# Patient Record
Sex: Male | Born: 1950
Health system: Southern US, Community
[De-identification: ages and names within clinical notes are randomized; demographics above are authoritative.]

## PROBLEM LIST (undated history)

## (undated) DIAGNOSIS — B159 Hepatitis A without hepatic coma: Secondary | ICD-10-CM

## (undated) DIAGNOSIS — Z8612 Personal history of poliomyelitis: Secondary | ICD-10-CM

## (undated) HISTORY — DX: Personal history of poliomyelitis: Z86.12

## (undated) HISTORY — DX: Hepatitis a without hepatic coma: B15.9

---

## 1973-03-30 DIAGNOSIS — Z8612 Personal history of poliomyelitis: Secondary | ICD-10-CM

## 1973-03-30 HISTORY — DX: Personal history of poliomyelitis: Z86.12

## 2009-02-27 ENCOUNTER — Emergency Department: Payer: Self-pay | Admitting: Emergency Medicine

## 2011-01-13 ENCOUNTER — Ambulatory Visit (INDEPENDENT_AMBULATORY_CARE_PROVIDER_SITE_OTHER): Payer: 59 | Admitting: Family Medicine

## 2011-01-13 ENCOUNTER — Encounter: Payer: Self-pay | Admitting: Family Medicine

## 2011-01-13 VITALS — BP 130/86 | HR 80 | Temp 98.3°F | Ht 65.4 in | Wt 143.0 lb

## 2011-01-13 DIAGNOSIS — M25519 Pain in unspecified shoulder: Secondary | ICD-10-CM

## 2011-01-13 DIAGNOSIS — M549 Dorsalgia, unspecified: Secondary | ICD-10-CM
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
## 2011-05-01 HISTORY — PX: COLONOSCOPY: SHX174

## 2011-09-01 ENCOUNTER — Telehealth: Payer: Self-pay

## 2011-09-01 NOTE — Telephone Encounter (Signed)
Pt request rx for lab order for [facility] prior to CPX on 09/21/11. Call pt when ready for pick up

## 2011-09-07 NOTE — Telephone Encounter (Signed)
done

## 2011-09-07 NOTE — Telephone Encounter (Signed)
Patient notified by telephone that order is up front and ready for pickup. 

## 2011-09-15 ENCOUNTER — Encounter: Payer: Self-pay | Admitting: Family Medicine

## 2011-09-21 ENCOUNTER — Encounter: Payer: Self-pay | Admitting: Family Medicine

## 2011-09-21 ENCOUNTER — Ambulatory Visit (INDEPENDENT_AMBULATORY_CARE_PROVIDER_SITE_OTHER): Payer: 59 | Admitting: Family Medicine

## 2011-09-21 VITALS — BP 130/82 | HR 77 | Temp 97.6°F | Ht 65.0 in | Wt 139.0 lb

## 2011-09-21 DIAGNOSIS — Z0000 Encounter for general adult medical examination without abnormal findings: Secondary | ICD-10-CM

## 2011-09-21 NOTE — Progress Notes (Signed)
Nature conservation officer at [facility] [address] [place] [address] Phone: [phone] Fax: [phone]   Patient Name: Danny Cameron Date of Birth: 1951/02/19 Medical Record Number: [id number] Gender: male Date of Encounter: 09/21/2011  History of Present Illness:  Danny Cameron is a 61 y.o. very pleasant male patient who presents with the following:  Zostavax  Some late age - cad. Father, etoh and smoking, died from 31, chf  Lateral raises - R posterior a little sore behind shoulder  Preventative Health Maintenance Visit:  Health Maintenance Summary Reviewed and updated, unless pt declines services.  Tobacco History Reviewed. Alcohol: No concerns, no excessive use Exercise Habits: below, 12 hours a week STD concerns: no risk or activity to increase risk Drug Use: None Encouraged self-testicular check  Health Maintenance  Topic Date Due  . Tetanus/tdap  03/22/1970  . Zostavax  03/23/2011  . Influenza Vaccine  12/29/2011  . Colonoscopy  06/14/2019    There is no problem list on file for this patient.  Past Medical History  Diagnosis Date  . History of post-polio syndrome 1975    No residual weakness   No past surgical history on file. History  Substance Use Topics  . Smoking status: Former Games developer  . Smokeless tobacco: Not on file  . Alcohol Use: Yes   Family History  Problem Relation Age of Onset  . Heart failure Father    No Known Allergies  Medication list has been reviewed and updated.  Prior to Admission medications   Medication Sig Start Date End Date Taking? Authorizing Provider  aspirin 81 MG tablet Take 81 mg by mouth daily.     Yes Historical Provider, MD  Multiple Vitamin (MULTIVITAMIN PO) Take 1 tablet by mouth daily.     Yes Historical Provider, MD    Review of Systems:   General: Denies fever, chills, sweats. No significant weight loss. Eyes: Denies blurring,significant itching ENT: Denies earache, sore throat,  and hoarseness. Cardiovascular: Denies chest pains, palpitations, dyspnea on exertion Respiratory: Denies cough, dyspnea at rest,wheeezing Breast: no concerns about lumps GI: Denies nausea, vomiting, diarrhea, constipation, change in bowel habits, abdominal pain, melena, hematochezia GU: Denies penile discharge, ED, urinary flow / outflow problems. No STD concerns. Musculoskeletal: Denies back pain, joint pain - shoulder as above Derm: Denies rash, itching Neuro: Denies  paresthesias, frequent falls, frequent headaches Psych: Denies depression, anxiety Endocrine: Denies cold intolerance, heat intolerance, polydipsia Heme: Denies enlarged lymph nodes Allergy: No hayfever  Physical Examination: Filed Vitals:   09/21/11 0754  BP: 130/82  Pulse: 77  Temp: 97.6 F (36.4 C)   Filed Vitals:   09/21/11 0754  Height: 5\' 5"  (1.651 m)  Weight: 139 lb (63.05 kg)   Body mass index is 23.13 kg/(m^2). Ideal Body Weight: Weight in (lb) to have BMI = 25: 149.9    Wt Readings from Last 3 Encounters:  09/21/11 139 lb (63.05 kg)  01/13/11 143 lb (64.864 kg)    GEN: well developed, well nourished, no acute distress Eyes: conjunctiva and lids normal, PERRLA, EOMI ENT: TM clear, nares clear, oral exam WNL Neck: supple, no lymphadenopathy, no thyromegaly, no JVD Pulm: clear to auscultation and percussion, respiratory effort normal CV: regular rate and rhythm, S1-S2, no murmur, rub or gallop, no bruits, peripheral pulses normal and symmetric, no cyanosis, clubbing, edema or varicosities Chest: no scars, masses GI: soft, non-tender; no hepatosplenomegaly, masses; active bowel sounds all quadrants GU: no hernia, testicular mass, penile discharge, or prostate enlargement Lymph:  no cervical, axillary or inguinal adenopathy MSK: gait normal, muscle tone and strength WNL, no joint swelling, effusions, discoloration, crepitus  SKIN: clear, good turgor, color WNL, no rashes, lesions, or  ulcerations Neuro: normal mental status, normal strength, sensation, and motion Psych: alert; oriented to person, place and time, normally interactive and not anxious or depressed in appearance.  Assessment and Plan: 1. Routine general medical examination at a health care facility     The patient's preventative maintenance and recommended screening tests for an annual wellness exam were reviewed in full today. Brought up to date unless services declined.  Counselled on the importance of diet, exercise, and its role in overall health and mortality. The patient's FH and SH was reviewed, including their home life, tobacco status, and drug and alcohol status.   Doing great - working out 12 hours a week, biking and swimming  Hannah Beat, MD

## 2011-10-22 ENCOUNTER — Ambulatory Visit: Payer: 59

## 2011-11-17 ENCOUNTER — Ambulatory Visit (INDEPENDENT_AMBULATORY_CARE_PROVIDER_SITE_OTHER): Payer: 59 | Admitting: *Deleted

## 2011-11-17 DIAGNOSIS — Z2911 Encounter for prophylactic immunotherapy for respiratory syncytial virus (RSV): Secondary | ICD-10-CM

## 2011-11-17 DIAGNOSIS — Z23 Encounter for immunization: Secondary | ICD-10-CM

## 2011-12-09 ENCOUNTER — Ambulatory Visit (INDEPENDENT_AMBULATORY_CARE_PROVIDER_SITE_OTHER): Payer: 59 | Admitting: Family Medicine

## 2011-12-09 ENCOUNTER — Encounter: Payer: Self-pay | Admitting: Family Medicine

## 2011-12-09 VITALS — BP 120/78 | HR 79 | Temp 97.9°F | Wt 138.0 lb

## 2011-12-09 DIAGNOSIS — M542 Cervicalgia: Secondary | ICD-10-CM

## 2011-12-09 NOTE — Progress Notes (Signed)
Nature conservation officer at [facility] [address] [place] [address] Phone: [phone] Fax: [phone]  Date:  12/09/2011   Name:  Danny Cameron   DOB:  December 18, 1950   MRN:  [id number] Gender: male Age: 61 y.o.  PCP:  Hannah Beat, MD    Chief Complaint: Shoulder Pain   History of Present Illness:  Danny Cameron is a 61 y.o. pleasant patient who presents with the following:  Sometimes having issues for a while, went to the chiropractor last week.   Primarily he is having some neck pain and stiffness in the posterior aspect of his neck. He did go to a chiropractor last week which did not really help at all. He denies any numbness or tingling. Some pain in the shoulder as well.  No significant pain with abduction or internal rotation. No prior traumatic injury or fracture or surgery in the affected regions to  There is no problem list on file for this patient.   Past Medical History  Diagnosis Date  . History of post-polio syndrome 1975    No residual weakness    No past surgical history on file.  History  Substance Use Topics  . Smoking status: Former Games developer  . Smokeless tobacco: Not on file  . Alcohol Use: Yes    Family History  Problem Relation Age of Onset  . Heart failure Father     No Known Allergies  Medication list has been reviewed and updated.  Current Outpatient Prescriptions on File Prior to Visit  Medication Sig Dispense Refill  . aspirin 81 MG tablet Take 81 mg by mouth daily.        . Multiple Vitamin (MULTIVITAMIN PO) Take 1 tablet by mouth daily.          Review of Systems:   GEN: No fevers, chills. Nontoxic. Primarily MSK c/o today. MSK: Detailed in the HPI GI: tolerating PO intake without difficulty Neuro: No numbness, parasthesias, or tingling associated. Otherwise the pertinent positives of the ROS are noted above.    Physical Examination: Filed Vitals:   12/09/11 0823  BP: 120/78  Pulse: 79  Temp: 97.9  F (36.6 C)   Filed Vitals:   12/09/11 0823  Weight: 138 lb (62.596 kg)   There is no height on file to calculate BMI. Ideal Body Weight:     GEN: Well-developed,well-nourished,in no acute distress; alert,appropriate and cooperative throughout examination HEENT: Normocephalic and atraumatic without obvious abnormalities. Ears, externally no deformities PULM: Breathing comfortably in no respiratory distress EXT: No clubbing, cyanosis, or edema PSYCH: Normally interactive. Cooperative during the interview. Pleasant. Friendly and conversant. Not anxious or depressed appearing. Normal, full affect.  CERVICAL SPINE EXAM Range of motion: Flexion, extension, lateral bending, and rotation: Full Pain with terminal motion: Full, no pain Spinous Processes: NT SCM: NT Upper paracervical muscles: mild tenderness Upper traps: mild tenderness C5-T1 intact, sensation and motor  Shoulder: B Inspection: No muscle wasting or winging Ecchymosis/edema: neg  AC joint, scapula, clavicle: NT Cervical spine: NT, full ROM Spurling's: neg Abduction: full, 5/5 Flexion: full, 5/5 IR, full, lift-off: 5/5 ER at neutral: full, 5/5 AC crossover and compression: neg Neer: neg Hawkins: neg Drop Test: neg Empty Can: neg Supraspinatus insertion: NT Bicipital groove: NT Speed's: neg Yergason's: neg Sulcus sign: neg Scapular dyskinesis: none C5-T1 intact Sensation intact Grip 5/5   Assessment and Plan:  1. Cervicalgia    Reviewed basic neck rehabilitation, McKenzie protocol with the patient. Do not suspect occult neurosurgical pathology. No  red flags.  Orders Today:  No orders of the defined types were placed in this encounter.    Medications Today: (Includes new updates added during medication reconciliation) No orders of the defined types were placed in this encounter.    Medications Discontinued: There are no discontinued medications.   Hannah Beat, MD

## 2011-12-22 ENCOUNTER — Telehealth: Payer: Self-pay | Admitting: Family Medicine

## 2011-12-22 NOTE — Telephone Encounter (Signed)
Caller: Harvest/Patient;  PCP: Hannah Beat (Family Practice); Best Callback Phone Number: [phone] Patient reports onset 12/21/11 of what he thinks are bites, on arms, genital area, afebrile, itchy, denies pain. Guideline: Bites, Insect. Disposition: Home care due to itchy bite, care advice offered, verbalizes understanding of call back parameters.

## 2011-12-22 NOTE — Telephone Encounter (Signed)
Noted  

## 2012-03-04 ENCOUNTER — Telehealth: Payer: Self-pay | Admitting: Family Medicine

## 2012-03-04 NOTE — Telephone Encounter (Signed)
Patient Information:  Caller Name: Mekhi  Phone: [phone]  Patient: Estell, Dillinger  Gender: Male  DOB: 03-21-1951  Age: 61 Years  PCP: Hannah Beat (Family Practice)   Symptoms  Reason For Call & Symptoms: Patient states right ear discomfort onset yesterday 03/03/12.  He states he has swam four days this week.  No pain with touching, no drainage.  He states down from his chin and down his neck is slightly painful. Dry throat but does not describe a sore throat. Top of head sensitive.  Reviewed Health History In EMR: Yes  Reviewed Medications In EMR: Yes  Reviewed Allergies In EMR: Yes  Reviewed Surgeries / Procedures: No  Date of Onset of Symptoms: 03/03/2012  Treatments Tried: Advil this morning.  Treatments Tried Worked: No  Guideline(s) Used:  Earache  Disposition Per Guideline:   See Today in Office  Reason For Disposition Reached:   All other earaches (Exceptions: earache lasting < 1 hour, and earache from air travel)  Advice Given:  Pain Medicines:  For pain relief, you can take either acetaminophen, ibuprofen, or naproxen.  They are over-the-counter (OTC) pain drugs. You can buy them at the drugstore.  Apply Cold to the Area for Pain:  Apply a cold pack or a cold wet washcloth to the outer ear for 20 minutes to reduce pain while the pain medicine takes effect (Note: Some individuals prefer local heat instead of cold for 20 minutes).  Call Back If  Earache last more than 1 hour  High fever, severe headache, or stiff neck occurs  You become worse.  Office Follow Up:  Does the office need to follow up with this patient?: No  Instructions For The Office: Declines appt today. Out of town. Request appt for tomorrow. Home care instructions provided.   Appointment Scheduled:  03/05/2012 10:00:00 Appointment Scheduled Provider:  Eustaquio Boyden [facility])

## 2012-03-05 ENCOUNTER — Telehealth: Payer: Self-pay | Admitting: Family Medicine

## 2012-03-05 ENCOUNTER — Encounter: Payer: Self-pay | Admitting: Family Medicine

## 2012-03-05 ENCOUNTER — Ambulatory Visit (INDEPENDENT_AMBULATORY_CARE_PROVIDER_SITE_OTHER): Payer: 59 | Admitting: Family Medicine

## 2012-03-05 VITALS — BP 160/100 | HR 76 | Temp 97.8°F | Wt 143.5 lb

## 2012-03-05 DIAGNOSIS — H60399 Other infective otitis externa, unspecified ear: Secondary | ICD-10-CM

## 2012-03-05 DIAGNOSIS — R030 Elevated blood-pressure reading, without diagnosis of hypertension: Secondary | ICD-10-CM

## 2012-03-05 DIAGNOSIS — IMO0001 Reserved for inherently not codable concepts without codable children: Secondary | ICD-10-CM

## 2012-03-05 DIAGNOSIS — H6091 Unspecified otitis externa, right ear: Secondary | ICD-10-CM

## 2012-03-05 MED ORDER — HYDROCORTISONE-ACETIC ACID 1-2 % OT SOLN: 5.0000 [drp] | Freq: Two times a day (BID) | OTIC | Status: DC

## 2012-03-05 NOTE — Telephone Encounter (Signed)
I tried to call pt today - unable to reach. Pt seen at [facility] - elevated bp noticed. Can we call to ask him to keep track of bp over next few weeks at home if he can, if not to go 1-2x/wk to local pharmacy or grocery store like [facility], [facility], [facility] and monitor bp.  If consistently >140/90, come in for further evaluation of hypertension.  In interim, lots of water, avoid salty foods/sodium.

## 2012-03-05 NOTE — Assessment & Plan Note (Signed)
Mild R external otitis.  Treat with vosol HC. Continue NSAID. Update if not improving with treatment. Pt agrees with plan.

## 2012-03-05 NOTE — Progress Notes (Addendum)
  Subjective:    Patient ID: Danny Cameron, male    DOB: Jun 24, 1950, 61 y.o.   MRN: [id number]  HPI CC: R ear pain  2-3 d h/o R ear pain, water clogged feeling.  No muffled hearing.  Feels some swollen glands R neck. No fevers/chills, congestion, RN, [facility]  Swimmer - increased swimming recently (4x this week)  Swims year round.  Has not had external ear infection in last 8 years.  Taking ibuprofen - helping.  HTNsive today - no h/o this.  Past Medical History  Diagnosis Date  . History of post-polio syndrome 1975    No residual weakness     Review of Systems Per HPI    Objective:   Physical Exam  Nursing note and vitals reviewed. Constitutional: He appears well-developed and well-nourished. No distress.  HENT:  Head: Normocephalic and atraumatic.  Right Ear: Hearing, tympanic membrane and external ear normal.  Left Ear: Hearing, tympanic membrane, external ear and ear canal normal.  Nose: Nose normal. No mucosal edema or rhinorrhea. Right sinus exhibits no maxillary sinus tenderness and no frontal sinus tenderness. Left sinus exhibits no maxillary sinus tenderness and no frontal sinus tenderness.  Mouth/Throat: Uvula is midline and mucous membranes are normal. No oropharyngeal exudate, posterior oropharyngeal edema, posterior oropharyngeal erythema or tonsillar abscesses.       Mild erythema anterior R ear canal.  No TM perforation.  Neck: Normal range of motion. Neck supple.  Lymphadenopathy:    He has cervical adenopathy (R AC LAD, mild).       Assessment & Plan:

## 2012-03-05 NOTE — Patient Instructions (Addendum)
You do have R outer ear infection. Treat with vosol hc to pharmacy twice daily for 7 days. Take ibuprofen 400mg  2-3 time daily with food. Out of swimming for 1 week starting from the point where ear doesn't hurt anymore.  Otitis Externa Otitis externa is a bacterial or fungal infection of the outer ear canal. This is the area from the eardrum to the outside of the ear. Otitis externa is sometimes called "swimmer's ear." CAUSES  Possible causes of infection include:  Swimming in dirty water.  Moisture remaining in the ear after swimming or bathing.  Mild injury (trauma) to the ear.  Objects stuck in the ear (foreign body).  Cuts or scrapes (abrasions) on the outside of the ear. SYMPTOMS  The first symptom of infection is often itching in the ear canal. Later signs and symptoms may include swelling and redness of the ear canal, ear pain, and yellowish-white fluid (pus) coming from the ear. The ear pain may be worse when pulling on the earlobe. DIAGNOSIS  Your caregiver will perform a physical exam. A sample of fluid may be taken from the ear and examined for bacteria or fungi. TREATMENT  Antibiotic ear drops are often given for 10 to 14 days. Treatment may also include pain medicine or corticosteroids to reduce itching and swelling. PREVENTION   Keep your ear dry. Use the corner of a towel to absorb water out of the ear canal after swimming or bathing.  Avoid scratching or putting objects inside your ear. This can damage the ear canal or remove the protective wax that lines the canal. This makes it easier for bacteria and fungi to grow.  Avoid swimming in lakes, polluted water, or poorly chlorinated pools.  You may use ear drops made of rubbing alcohol and vinegar after swimming. Combine equal parts of white vinegar and alcohol in a bottle. Put 3 or 4 drops into each ear after swimming. HOME CARE INSTRUCTIONS   Apply antibiotic ear drops to the ear canal as prescribed by your  caregiver.  Only take over-the-counter or prescription medicines for pain, discomfort, or fever as directed by your caregiver.  If you have diabetes, follow any additional treatment instructions from your caregiver.  Keep all follow-up appointments as directed by your caregiver. SEEK MEDICAL CARE IF:   You have a fever.  Your ear is still red, swollen, painful, or draining pus after 3 days.  Your redness, swelling, or pain gets worse.  You have a severe headache.  You have redness, swelling, pain, or tenderness in the area behind your ear. MAKE SURE YOU:   Understand these instructions.  Will watch your condition.  Will get help right away if you are not doing well or get worse. Document Released: 03/16/2005 Document Revised: 06/08/2011 Document Reviewed: 04/02/2011 [facility] Patient Information 2013 [place], [place].

## 2012-03-05 NOTE — Assessment & Plan Note (Signed)
Noted elevated blood pressure - no history of same. I will ask Selena Batten to call pt on Monday to start monitoring bp at home or at local store - if staying consistently elevated, come in for further eval.

## 2012-03-07 NOTE — Telephone Encounter (Signed)
Patient notified as instructed by telephone. Was advised by patient that he has a BP monitor kit at home and has been monitoring his BP and it normally is not high. Patient stated that he has seen Dr. Park Breed in the past and he recommended a stress test, but patient does not want to have that done. Patient states that he will continue to monitor his BP and if it is consistently >140/90 he will set up an appointment with his PCP and come in to discuss it.

## 2012-03-07 NOTE — Telephone Encounter (Signed)
Noted. Thanks.

## 2012-03-10 ENCOUNTER — Encounter: Payer: Self-pay | Admitting: Family Medicine

## 2012-03-10 ENCOUNTER — Ambulatory Visit (INDEPENDENT_AMBULATORY_CARE_PROVIDER_SITE_OTHER): Payer: 59 | Admitting: Family Medicine

## 2012-03-10 VITALS — BP 140/92 | HR 77 | Temp 98.0°F | Ht 65.0 in | Wt 136.0 lb

## 2012-03-10 DIAGNOSIS — IMO0001 Reserved for inherently not codable concepts without codable children: Secondary | ICD-10-CM

## 2012-03-10 DIAGNOSIS — R030 Elevated blood-pressure reading, without diagnosis of hypertension: Secondary | ICD-10-CM

## 2012-03-10 NOTE — Progress Notes (Signed)
   Nature conservation officer at [facility] [address] [place] [address] Phone: [phone] Fax: [phone]  Date:  03/10/2012   Name:  Danny Cameron   DOB:  [birth year]   MRN:  [id number] Gender: male Age: 61 y.o.  PCP:  Hannah Beat, MD  Evaluating MD: Hannah Beat, MD   Chief Complaint: Hypertension   History of Present Illness:  Danny Cameron is a 62 y.o. pleasant patient who presents with the following:  Swam a lot last week, and had some OE, had a swollen gland. Using drops, taking some ibupfren.   Patient Active Problem List  Diagnosis  . Elevated BP    Past Medical History  Diagnosis Date  . History of post-polio syndrome 1975    No residual weakness    No past surgical history on file.  History  Substance Use Topics  . Smoking status: Former Games developer  . Smokeless tobacco: Not on file  . Alcohol Use: Yes    Family History  Problem Relation Age of Onset  . Heart failure Father     No Known Allergies  Medication list has been reviewed and updated.  Outpatient Prescriptions Prior to Visit  Medication Sig Dispense Refill  . acetic acid-hydrocortisone (VOSOL-HC) otic solution Place 5 drops into the right ear 2 (two) times daily.  10 mL  0  . aspirin 81 MG tablet Take 81 mg by mouth daily.        . Multiple Vitamin (MULTIVITAMIN PO) Take 1 tablet by mouth daily.         Last reviewed on 03/10/2012  8:21 AM by Consuello Masse, CMA  Review of Systems:   GEN: No acute illnesses, no fevers, chills. GI: No n/v/d, eating normally Pulm: No SOB Interactive and getting along well at home.  Otherwise, ROS is as per the HPI.   Physical Examination: Filed Vitals:   03/10/12 0820  BP: 140/92  Pulse: 77  Temp: 98 F (36.7 C)  TempSrc: Oral  Height: 5\' 5"  (1.651 m)  Weight: 136 lb (61.689 kg)  SpO2: 99%    Body mass index is 22.63 kg/(m^2). Ideal Body Weight: Weight in (lb) to have BMI = 25: 149.9    GEN: WDWN, NAD,  Non-toxic, Alert & Oriented x 3 HEENT: Atraumatic, Normocephalic.  Ears and Nose: No external deformity. EXTR: No clubbing/cyanosis/edema NEURO: Normal gait.  PSYCH: Normally interactive. Conversant. Not depressed or anxious appearing.  Calm demeanor.    Assessment and Plan: 1. Elevated BP     >10 minutes spent in face to face time with patient, >50% spent in counselling or coordination of care : discussion regarding BP, diet, exercise. Some readings  > 140/90, but many not. OK to follow for now.   If ear not better by Monday, will call in different OE abx  Hannah Beat, MD

## 2012-03-14 ENCOUNTER — Telehealth: Payer: Self-pay | Admitting: *Deleted

## 2012-03-14 MED ORDER — OFLOXACIN 0.3 % OT SOLN: 10.0000 [drp] | Freq: Every day | OTIC | Status: DC

## 2012-03-14 NOTE — Telephone Encounter (Signed)
Done   Hannah Beat, MD 03/14/2012, 10:09 AM

## 2012-03-14 NOTE — Telephone Encounter (Signed)
Patient calling says that he was told to call back if ear not better by Monday to get another antibiotic. Patient says his ear is still not feeling 100% and would like [facility] [address]

## 2012-03-24 ENCOUNTER — Telehealth: Payer: Self-pay | Admitting: *Deleted

## 2012-03-24 NOTE — Telephone Encounter (Signed)
error 

## 2012-03-28 ENCOUNTER — Institutional Professional Consult (permissible substitution): Payer: 59 | Admitting: Family Medicine

## 2012-03-29 ENCOUNTER — Ambulatory Visit (INDEPENDENT_AMBULATORY_CARE_PROVIDER_SITE_OTHER): Payer: 59 | Admitting: Family Medicine

## 2012-03-29 ENCOUNTER — Encounter: Payer: Self-pay | Admitting: Family Medicine

## 2012-03-29 VITALS — BP 158/92 | HR 72 | Temp 97.8°F | Wt 142.8 lb

## 2012-03-29 DIAGNOSIS — H6090 Unspecified otitis externa, unspecified ear: Secondary | ICD-10-CM

## 2012-03-29 DIAGNOSIS — H60399 Other infective otitis externa, unspecified ear: Secondary | ICD-10-CM

## 2012-03-29 NOTE — Patient Instructions (Addendum)
Use nasal saline twice a day.  If you need a copy of the note, then request it at the end of the week.  Take care.   You can start back swimming next week.

## 2012-03-29 NOTE — Progress Notes (Signed)
Prev seen mid 12/13 with R otitis externa.  Had been swimming a lot prev to the visit.  Since then, some improvement with the ear pain but it still feels clogged.  LA in neck is resolved.  L ear is fine.  On FCNAVD.    Mother is ill, 61 years old, and isn't doing well.  He's been in [place] seeing her.    Meds, vitals, and allergies reviewed.   ROS: See HPI.  Otherwise, noncontributory.  GEN: nad, alert and oriented HEENT: mucous membranes moist, TM wnl B w/o erythema, no canal erythema, OP wnl NECK: supple w/o LA

## 2012-03-30 DIAGNOSIS — H6090 Unspecified otitis externa, unspecified ear: Secondary | ICD-10-CM | POA: Insufficient documentation

## 2012-03-30 NOTE — Assessment & Plan Note (Signed)
Resolved, he can use nasal saline in case he has a component of ETD. O/w f/u prn.  D/w pt.

## 2012-04-20 ENCOUNTER — Telehealth: Payer: Self-pay | Admitting: Family Medicine

## 2012-04-20 NOTE — Telephone Encounter (Signed)
Patient Information:  Caller Name: Benjie  Phone: [phone]  Patient: Danny Cameron, Danny Cameron  Gender: Male  DOB: 1951-02-22  Age: 62 Years  PCP: Hannah Beat (Family Practice)  Office Follow Up:  Does the office need to follow up with this patient?: No  Instructions For The Office: N/A   Symptoms  Reason For Call & Symptoms: pt has been dealing with recovering from swimmers ear since 03/11/13.  Pt reports he can still feel ear popping  Reviewed Health History In EMR: Yes  Reviewed Medications In EMR: Yes  Reviewed Allergies In EMR: Yes  Reviewed Surgeries / Procedures: Yes  Date of Onset of Symptoms: 03/11/2012  Treatments Tried: antibiotics  Treatments Tried Worked: No  Guideline(s) Used:  Ear - Congestion  Disposition Per Guideline:   See Within 3 Days in Office  Reason For Disposition Reached:   Ear congestion present > 48 hours  Advice Given:  Reassurance:  Eustacian tube: There is a small collapsible tube that runs between the middle ear and the nose. Normally, it permits tiny amounts of air to move in and out of the middle ear. When the tube gets blocked, air or fluid can build up behind the ear drum (tympanic membrane). This causes the symptoms of ear congestion.  Treatment - Chewing and Swallowing:   Try chewing gum.  You can also try swallowing water while pinching your nostrils closed. The reason this works is that it creates a small vacuum in the nose. This helps the eustachian tube to open up.  Treatment - Decongestant Nasal Spray:  If chewing or swallowing doesn't help after 1 or 2 hours, you can try using an over-the-counter (OTC) nasal decongestant drops. You can use the nose drops twice a day.  Oxymetazoline Nasal Drops (e.g., Afrin): Available OTC. Clean out the nose before using. Spray each nostril once, wait one minute for it to absorb, and then spray a second time.  Phenylephrine Nasal Drops (e.g., Neo-Synephrine): Available OTC. Clean out the  nose before using. Spray each nostril once, wait one minute for it to absorb, and then spray a second time.  Call Back If:   You become worse.  Patient Refused Recommendation:  Patient Refused Appt, Patient Requests Appt At Later Date  pt will try home measures and call back next week for an appt

## 2012-07-21 ENCOUNTER — Ambulatory Visit (INDEPENDENT_AMBULATORY_CARE_PROVIDER_SITE_OTHER): Payer: 59 | Admitting: Family Medicine

## 2012-07-21 ENCOUNTER — Encounter: Payer: Self-pay | Admitting: Family Medicine

## 2012-07-21 VITALS — BP 120/88 | HR 76 | Temp 97.9°F | Ht 65.0 in | Wt 138.5 lb

## 2012-07-21 DIAGNOSIS — J069 Acute upper respiratory infection, unspecified: Secondary | ICD-10-CM

## 2012-07-21 NOTE — Progress Notes (Signed)
  Subjective:    Patient ID: Danny Cameron, male    DOB: Sep 05, 1950, 62 y.o.   MRN: [id number]  Cough This is a new problem. The current episode started 1 to 4 weeks ago (Flew on plane 4/12). The problem has been gradually worsening. The problem occurs hourly (toruble sleepoing at noght from cough). The cough is productive of sputum. Associated symptoms include chills, ear congestion, myalgias and nasal congestion. Pertinent negatives include no ear pain, fever, hemoptysis, postnasal drip, rhinorrhea, sore throat, shortness of breath or wheezing. Associated symptoms comments: Initially chills, no fever.. Chills resolved now  slightly itchy eyes, no sneeze  fatigue. Nothing aggravates the symptoms. Risk factors for lung disease include smoking/tobacco exposure (Former remote ). He has tried nothing for the symptoms. There is no history of asthma, bronchiectasis, bronchitis, COPD, emphysema, environmental allergies or pneumonia.      Review of Systems  Constitutional: Positive for chills. Negative for fever.  HENT: Negative for ear pain, sore throat, rhinorrhea and postnasal drip.   Respiratory: Positive for cough. Negative for hemoptysis, shortness of breath and wheezing.   Musculoskeletal: Positive for myalgias.  Allergic/Immunologic: Negative for environmental allergies.       Objective:   Physical Exam  Constitutional: Vital signs are normal. He appears well-developed and well-nourished.  Non-toxic appearance. He does not appear ill. No distress.  HENT:  Head: Normocephalic and atraumatic.  Right Ear: Hearing, tympanic membrane, external ear and ear canal normal. No tenderness. No foreign bodies. Tympanic membrane is not retracted and not bulging.  Left Ear: Hearing, tympanic membrane, external ear and ear canal normal. No tenderness. No foreign bodies. Tympanic membrane is not retracted and not bulging.  Nose: Nose normal. No mucosal edema or rhinorrhea. Right sinus exhibits no  maxillary sinus tenderness and no frontal sinus tenderness. Left sinus exhibits no maxillary sinus tenderness and no frontal sinus tenderness.  Mouth/Throat: Uvula is midline, oropharynx is clear and moist and mucous membranes are normal. Normal dentition. No dental caries. No oropharyngeal exudate or tonsillar abscesses.  Eyes: Conjunctivae, EOM and lids are normal. Pupils are equal, round, and reactive to light. No foreign bodies found.  Neck: Trachea normal, normal range of motion and phonation normal. Neck supple. Carotid bruit is not present. No mass and no thyromegaly present.  Cardiovascular: Normal rate, regular rhythm, S1 normal, S2 normal, normal heart sounds, intact distal pulses and normal pulses.  Exam reveals no gallop.   No murmur heard. Pulmonary/Chest: Effort normal and breath sounds normal. No respiratory distress. He has no wheezes. He has no rhonchi. He has no rales.  Abdominal: Soft. Normal appearance and bowel sounds are normal. There is no hepatosplenomegaly. There is no tenderness. There is no rebound, no guarding and no CVA tenderness. No hernia.  Neurological: He is alert. He has normal reflexes.  Skin: Skin is warm, dry and intact. No rash noted.  Psychiatric: He has a normal mood and affect. His speech is normal and behavior is normal. Judgment normal.          Assessment & Plan:

## 2012-07-21 NOTE — Patient Instructions (Addendum)
Mucinex DM twice daily. Nasal saline irrigation  ( Netty pot) or spray 2-3 times a day. If you are not improving in 5-7 days... Or new measured fever... Call.

## 2012-08-01 ENCOUNTER — Telehealth: Payer: Self-pay | Admitting: Family Medicine

## 2012-08-01 NOTE — Telephone Encounter (Signed)
Patient is scheduled for his cpx on 09/22/12.  Patient works for [employer] and would like an order for lab work mailed to him.

## 2012-08-05 ENCOUNTER — Telehealth: Payer: Self-pay | Admitting: Family Medicine

## 2012-08-05 NOTE — Telephone Encounter (Signed)
Pt is scheduled for a CPE on May 29th.  He works for [employer] and needs to have his labs drawn there. Can you write an order for the labs and call him when it is ready to pick up? Thank you.

## 2012-08-08 MED ORDER — NONFORMULARY OR COMPOUNDED ITEM: Status: DC

## 2012-08-08 NOTE — Telephone Encounter (Signed)
Done   Hannah Beat, MD 08/08/2012, 1:53 PM

## 2012-08-08 NOTE — Telephone Encounter (Signed)
done

## 2012-08-25 ENCOUNTER — Encounter: Payer: Self-pay | Admitting: Family Medicine

## 2012-08-25 ENCOUNTER — Telehealth: Payer: Self-pay

## 2012-08-25 ENCOUNTER — Ambulatory Visit (INDEPENDENT_AMBULATORY_CARE_PROVIDER_SITE_OTHER): Payer: 59 | Admitting: Family Medicine

## 2012-08-25 VITALS — BP 124/88 | HR 74 | Temp 98.1°F | Ht 65.0 in | Wt 132.0 lb

## 2012-08-25 DIAGNOSIS — Z0000 Encounter for general adult medical examination without abnormal findings: Secondary | ICD-10-CM

## 2012-08-25 NOTE — Progress Notes (Signed)
Nature conservation officer at [facility] [address] [place] [address] Phone: [phone] Fax: [phone]  Date:  08/25/2012   Name:  Danny Cameron   DOB:  1951/03/07   MRN:  [id number] Gender: male Age: 62 y.o.  Primary Physician:  Hannah Beat, MD  Evaluating MD: Hannah Beat, MD   Chief Complaint: Annual Exam   History of Present Illness:  Danny Cameron is a 62 y.o. pleasant patient who presents with the following:  1500 miles on bike Swimming a mile twice a week  Preventative Health Maintenance Visit:  Health Maintenance Summary Reviewed and updated, unless pt declines services.  Tobacco History Reviewed. Alcohol: No concerns, no excessive use Exercise Habits: above STD concerns: no risk or activity to increase risk Drug Use: None Encouraged self-testicular check  Labs reviewed, normal  Patient Active Problem List   Diagnosis Date Noted  . Elevated BP 03/05/2012    Past Medical History  Diagnosis Date  . History of post-polio syndrome 1975    No residual weakness    No past surgical history on file.  History   Social History  . Marital Status: Single    Spouse Name: N/A    Number of Children: 0  . Years of Education: N/A   Occupational History  . 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 Smoking status: Former Games developer  . Smokeless tobacco: Never Used  . Alcohol Use: Yes  . Drug Use: No  . Sexually Active: Not on file   Other Topics Concern  . Not on file   Social History Narrative   Active cyclist, swimmer   Exercises 12 hours a week. Swims 1 mile at a time, 1 hour twice a week    Family History  Problem Relation Age of Onset  . Heart failure Father     No Known Allergies  Medication list has been reviewed and updated.  Outpatient Prescriptions Prior to Visit  Medication Sig Dispense Refill  . aspirin 81 MG tablet Take 81 mg by mouth daily.       . Multiple Vitamin (MULTIVITAMIN  PO) Take 1 tablet by mouth daily.        . Isopropyl Alcohol (SWIM EAR OT) Place in ear(s) as needed.      . NONFORMULARY OR COMPOUNDED ITEM Epic Account: [id number] Lab Studies: BMP, CBC with diff, HFP: v58.69 FLP: 272.4 PSA: v76.44 Nicotine and metabolite, Quant: v70.0  1 each  0  . pseudoephedrine (SUDAFED) 30 MG tablet Take 30 mg by mouth every 4 (four) hours as needed.       No facility-administered medications prior to visit.    Review of Systems:   General: Denies fever, chills, sweats. No significant weight loss. Eyes: Denies blurring,significant itching ENT: Denies earache, sore throat, and hoarseness. Cardiovascular: Denies chest pains, palpitations, dyspnea on exertion Respiratory: Denies cough, dyspnea at rest,wheeezing Breast: no concerns about lumps GI: Denies nausea, vomiting, diarrhea, constipation, change in bowel habits, abdominal pain, melena, hematochezia GU: Denies penile discharge, ED, urinary flow / outflow problems. No STD concerns. Musculoskeletal: Denies back pain, joint pain Derm: Denies rash, itching Neuro: Denies  paresthesias, frequent falls, frequent headaches Psych: Denies depression, anxiety Endocrine: Denies cold intolerance, heat intolerance, polydipsia Heme: Denies enlarged lymph nodes Allergy: No hayfever   Physical Examination: BP 124/88  Pulse 74  Temp(Src) 98.1 F (36.7 C) (Oral)  Ht 5\' 5"  (1.651 m)  Wt 132 lb (59.875 kg)  BMI 21.97 kg/m2  SpO2  98%  Ideal Body Weight: Weight in (lb) to have BMI = 25: 149.9   Wt Readings from Last 3 Encounters:  08/25/12 132 lb (59.875 kg)  07/21/12 138 lb 8 oz (62.823 kg)  03/29/12 142 lb 12 oz (64.751 kg)    GEN: well developed, well nourished, no acute distress Eyes: conjunctiva and lids normal, PERRLA, EOMI ENT: TM clear, nares clear, oral exam WNL Neck: supple, no lymphadenopathy, no thyromegaly, no JVD Pulm: clear to auscultation and percussion, respiratory effort normal CV: regular  rate and rhythm, S1-S2, no murmur, rub or gallop, no bruits, peripheral pulses normal and symmetric, no cyanosis, clubbing, edema or varicosities Chest: no scars, masses GI: soft, non-tender; no hepatosplenomegaly, masses; active bowel sounds all quadrants GU: no hernia, testicular mass, penile discharge, or prostate enlargement Lymph: no cervical, axillary or inguinal adenopathy MSK: gait normal, muscle tone and strength WNL, no joint swelling, effusions, discoloration, crepitus  SKIN: clear, good turgor, color WNL, no rashes, lesions, or ulcerations Neuro: normal mental status, normal strength, sensation, and motion Psych: alert; oriented to person, place and time, normally interactive and not anxious or depressed in appearance.  Assessment and Plan:  Routine general medical examination at a health care facility  The patient's preventative maintenance and recommended screening tests for an annual wellness exam were reviewed in full today. Brought up to date unless services declined.  Counselled on the importance of diet, exercise, and its role in overall health and mortality. The patient's FH and SH was reviewed, including their home life, tobacco status, and drug and alcohol status.   Doing great.  Orders Today:  No orders of the defined types were placed in this encounter.    Updated Medication List: (Includes new medications, updates to list, dose adjustments) No orders of the defined types were placed in this encounter.    Medications Discontinued: Medications Discontinued During This Encounter  Medication Reason  . NONFORMULARY OR COMPOUNDED ITEM Error  . Isopropyl Alcohol (SWIM EAR OT) Error  . pseudoephedrine (SUDAFED) 30 MG tablet Error      Signed, Caniya Tagle T. Jessaca Philippi, MD 08/25/2012 9:05 AM

## 2012-08-25 NOTE — Telephone Encounter (Signed)
i ordered it and it is clearly on my order. My only explanation is that it was an error from [employer].    Hannah Beat, MD 08/25/2012, 3:22 PM

## 2012-08-25 NOTE — Telephone Encounter (Signed)
Pt seen today; pt wants to know why lipid panel was not done this year. Please advise.

## 2012-08-25 NOTE — Telephone Encounter (Signed)
Left message advising patient as requested by dr copland

## 2012-08-26 ENCOUNTER — Telehealth: Payer: Self-pay | Admitting: *Deleted

## 2012-08-26 NOTE — Telephone Encounter (Signed)
Patient called back and asked if he really needs his cholesterol checked since it has been so good in the past.

## 2012-08-28 NOTE — Telephone Encounter (Signed)
I think it should be ok to wait this year - always perfect.   Hannah Beat, MD 08/28/2012, 4:06 PM

## 2012-08-29 NOTE — Telephone Encounter (Signed)
Left message with recommendations on home phone

## 2012-08-31 ENCOUNTER — Encounter: Payer: Self-pay | Admitting: Family Medicine

## 2012-09-22 ENCOUNTER — Encounter: Payer: 59 | Admitting: Family Medicine

## 2012-10-14 ENCOUNTER — Telehealth: Payer: Self-pay

## 2012-10-14 NOTE — Telephone Encounter (Signed)
Left message on home voice mail with information asked patient to call back if any other questions

## 2012-10-14 NOTE — Telephone Encounter (Signed)
Pt left v/m; pt was reviewing labs done in 07/2012 and wanted to know since pt does not smoke why a nicotine test was done. Please advise.

## 2012-10-14 NOTE — Telephone Encounter (Signed)
I believe that he works for [employer] (or his wife), and in years past that employer typically has required proof via bloodtest if a patient does not use tobacco for insurance rebate purposes. If he did not want it done, then my apologies.

## 2012-11-25 ENCOUNTER — Telehealth: Payer: Self-pay | Admitting: Family Medicine

## 2012-11-25 NOTE — Telephone Encounter (Signed)
Patient Information:  Caller Name: Jeffree  Phone: [phone]  Patient: Danny Cameron, Danny Cameron  Gender: Male  DOB: 21-Apr-1950  Age: 62 Years  PCP: Hannah Beat (Family Practice)  Office Follow Up:  Does the office need to follow up with this patient?: No  Instructions For The Office: N/A  RN Note:  Actively exercises with weights and push ups, swimming and bicycling. Pain increased after swimming 1 mile in 50 minutes 11/24/12.  History of torn rotator cuff 20 yrs ago. Declined to schedule appointment now.  Will back off exercises  that aggravate arm pain and call for appointment, if symptoms continue.  Symptoms  Reason For Call & Symptoms: Right upper arm "soreness" over past couple of weeks. Today, 11/25/12 the muscle in upper right arm is really sore and too painful for exercise.  Reviewed Health History In EMR: Yes  Reviewed Medications In EMR: Yes  Reviewed Allergies In EMR: Yes  Reviewed Surgeries / Procedures: Yes  Date of Onset of Symptoms: 11/11/2012  Treatments Tried: Meloxicam, Tiger balm topical  Treatments Tried Worked: Yes  Guideline(s) Used:  Arm Pain  Disposition Per Guideline:   See Within 2 Weeks in Office  Reason For Disposition Reached:   Mild pain and present > 7 days  Advice Given:  Reassurance - Muscle Strain  Definition: A muscle strain occurs from over-stretching or tearing a muscle. People often call this a "pulled muscle". This muscle injury can occur while exercising, while lifting something, or sometimes during normal activities. -  Symptoms: People often describe a sharp pain or popping when the muscle strain occurs. The muscle pain worsens with movement of the arm.  Reassurance - Overuse  Definition: Sore muscles are common following vigorous activity (overuse injury), especially when your body is not used to this amount of activity (e.g., sports, weight lifting, moving furniture).  Symptoms: People often describe a diffuse soreness and  aching in the over-used muscles.  Here is some care advice that should help.  Apply Cold to the Area for First 48 Hours  Apply a cold pack or an ice bag (wrapped in a moist towel) to the area for 20 minutes. Repeat in 1 hour, then every 4 hours while awake.  Continue this for the first 48 hours after an injury (Reason: to reduce the swelling and pain).  Apply Heat to the Area:  Beginning 48 hours after an injury, apply a warm washcloth or heating pad for 10 minutes 3 times a day.  This will help increase blood flow and improve healing.  Local Heat  (shower option): If stiffness lasts over 48 hours, relax in a hot shower twice a day and gently exercise the involved part under the falling water.  Rest:   You should try to avoid any exercise or activity that caused this pain for the next 3 days.  Pain Medicines:  For pain relief, you can take either acetaminophen, ibuprofen, or naproxen.  Ibuprofen (e.g., Motrin, Advil):  Take 400 mg (two 200 mg pills) by mouth every 6 hours.  Another choice is to take 600 mg (three 200 mg pills) by mouth every 8 hours.  The most you should take each day is 1,200 mg (six 200 mg pills), unless your doctor has told you to take more.  Expected Course  Muscle Strain: A minor muscle strain usually hurts for 2 - 3 days. The pain often peaks on day 2. A more severe muscle strain can hurt for 2-4 weeks.  Muscle Overuse: Sore  muscles from overuse usually hurts for 2 - 4 days. The pain often peaks on day 2  Call Back If:  Moderate pain (e.g. interferes with normal activities) lasts more than 3 days  Mild pain lasts more than 7 days  You become worse.  RN Overrode Recommendation:  Follow Up With Office Later  Declined to schedule now.

## 2012-11-29 NOTE — Telephone Encounter (Signed)
Reasonable poc 

## 2012-12-01 ENCOUNTER — Ambulatory Visit (INDEPENDENT_AMBULATORY_CARE_PROVIDER_SITE_OTHER): Payer: BC Managed Care – PPO | Admitting: Family Medicine

## 2012-12-01 ENCOUNTER — Encounter: Payer: Self-pay | Admitting: Family Medicine

## 2012-12-01 ENCOUNTER — Telehealth: Payer: Self-pay | Admitting: Family Medicine

## 2012-12-01 VITALS — BP 120/80 | HR 55 | Temp 98.3°F | Ht 65.0 in | Wt 130.0 lb

## 2012-12-01 DIAGNOSIS — S43429A Sprain of unspecified rotator cuff capsule, initial encounter: Secondary | ICD-10-CM

## 2012-12-01 DIAGNOSIS — S46019A Strain of muscle(s) and tendon(s) of the rotator cuff of unspecified shoulder, initial encounter: Secondary | ICD-10-CM

## 2012-12-01 NOTE — Telephone Encounter (Signed)
Patient said he left a message for you last week about upper arm and shoulder pain.  Patient has waited a week and it's still bothering him.  The pain is waking him up at night.  Patient said he's putting hot and cold on it and taking ibuprofen.  Patient wants to know if you can see him today.  He's leaving for [place] tomorrow to see his mother who's ill.  Patient can be called back on his home or cell phone number.

## 2012-12-01 NOTE — Telephone Encounter (Signed)
ok 

## 2012-12-01 NOTE — Progress Notes (Signed)
Nature conservation officer at [facility] [address] [place] [address] Phone: [phone] Fax: [phone]  Date:  12/01/2012   Name:  Danny Cameron   DOB:  02/28/1951   MRN:  [id number] Gender: male Age: 62 y.o.  Primary Physician:  Hannah Beat, MD  Evaluating MD: Hannah Beat, MD   Chief Complaint: Arm Pain and Shoulder Pain   History of Present Illness:  Danny Cameron is a 62 y.o. pleasant patient who presents with the following:  1 week ago, indolent worsening.  Overall quite fit individual who is generally swimming about an hour a day had some increased pain in his RIGHT shoulder when he was swimming very hard week or so ago. Since then, he stopped his swimming workouts, and he has been able to maintain some weightlifting and has been cycling without any difficulty.  He has been doing some rehabilitation in the pool. No known dislocation. No fracture to his knowledge. He did have an old injury 20 or 30 years ago where he likely for his rotator cuff fully or have high-grade partial tear.  Patient Active Problem List   Diagnosis Date Noted  . Elevated BP 03/05/2012    Past Medical History  Diagnosis Date  . History of post-polio syndrome 1975    No residual weakness    No past surgical history on file.  History   Social History  . Marital Status: Single    Spouse Name: N/A    Number of Children: 0  . Years of Education: N/A   Occupational History  . 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 Smoking status: Former Games developer  . Smokeless tobacco: Never Used  . Alcohol Use: Yes     Comment:  one beer a night  . Drug Use: No  . Sexual Activity: Not on file   Other Topics Concern  . Not on file   Social History Narrative   Active cyclist, swimmer   Exercises 12 hours a week. Swims 1 mile at a time, 1 hour twice a week    Family History  Problem Relation Age of Onset  . Heart failure Father     No Known  Allergies  Medication list has been reviewed and updated.  Outpatient Prescriptions Prior to Visit  Medication Sig Dispense Refill  . aspirin 81 MG tablet Take 81 mg by mouth daily.       . Multiple Vitamin (MULTIVITAMIN PO) Take 1 tablet by mouth daily.         No facility-administered medications prior to visit.    Review of Systems:   GEN: No fevers, chills. Nontoxic. Primarily MSK c/o today. MSK: Detailed in the HPI GI: tolerating PO intake without difficulty Neuro: No numbness, parasthesias, or tingling associated. Otherwise the pertinent positives of the ROS are noted above.    Physical Examination: BP 120/80  Pulse 55  Temp(Src) 98.3 F (36.8 C) (Oral)  Ht 5\' 5"  (1.651 m)  Wt 130 lb (58.968 kg)  BMI 21.63 kg/m2  Ideal Body Weight: Weight in (lb) to have BMI = 25: 149.9   GEN: Well-developed,well-nourished,in no acute distress; alert,appropriate and cooperative throughout examination HEENT: Normocephalic and atraumatic without obvious abnormalities. Ears, externally no deformities PULM: Breathing comfortably in no respiratory distress EXT: No clubbing, cyanosis, or edema PSYCH: Normally interactive. Cooperative during the interview. Pleasant. Friendly and conversant. Not anxious or depressed appearing. Normal, full affect.  Shoulder: R Inspection: No muscle wasting or winging Ecchymosis/edema: neg  AC joint, scapula, clavicle: NT Cervical spine: NT, full ROM Spurling's: neg Abduction: full, 5/5 - mild pain Flexion: full, 5/5 IR, full, lift-off: 5/5 ER at neutral: full, 5/5 AC crossover: neg Neer: neg Hawkins: pos Drop Test: neg Empty Can: pos Supraspinatus insertion: nt Bicipital groove: NT Speed's: neg Yergason's: neg Sulcus sign: neg Scapular dyskinesis: none C5-T1 intact  Neuro: Sensation intact Grip 5/5   Assessment and Plan: Rotator cuff strain, unspecified laterality, initial encounter   Isolated out of the supraspinatus. Conservative  management at this point. Acute rehabilitation.  He is not doing better in one month, and he can follow up and will talk about other measures  Signed, Montel Vanderhoof T. Bristyl Mclees, MD 12/01/2012 5:24 PM

## 2013-01-04 ENCOUNTER — Ambulatory Visit (INDEPENDENT_AMBULATORY_CARE_PROVIDER_SITE_OTHER): Payer: BC Managed Care – PPO | Admitting: Family Medicine

## 2013-01-04 ENCOUNTER — Encounter: Payer: Self-pay | Admitting: Family Medicine

## 2013-01-04 ENCOUNTER — Telehealth: Payer: Self-pay

## 2013-01-04 VITALS — BP 110/70 | HR 76 | Temp 97.9°F | Ht 65.0 in | Wt 130.0 lb

## 2013-01-04 DIAGNOSIS — M25519 Pain in unspecified shoulder: Secondary | ICD-10-CM

## 2013-01-04 DIAGNOSIS — M67919 Unspecified disorder of synovium and tendon, unspecified shoulder: Secondary | ICD-10-CM

## 2013-01-04 DIAGNOSIS — M7581 Other shoulder lesions, right shoulder: Secondary | ICD-10-CM

## 2013-01-04 NOTE — Telephone Encounter (Signed)
Pt seen 12/01/12, rt shoulder pain still painful and pulling sensation when raises arm or lifts bike onto wall hangers; better than when seen but wants to discuss other options. Pt still doing exercise regimen. Pt scheduled appt today at 10:45 am to see Dr Patsy Lager.

## 2013-01-04 NOTE — Progress Notes (Signed)
Nature conservation officer at [facility] [address] [place] [address] Phone: [phone] Fax: [phone]  Date:  01/04/2013   Name:  Danny Cameron   DOB:  1950-08-26   MRN:  [id number] Gender: male Age: 62 y.o.  Primary Physician:  Hannah Beat, MD   Chief Complaint: Shoulder Pain   History of Present Illness:  Danny Cameron is a 62 y.o. pleasant patient who presents with the following:  F/u R shoulder:  Doing a bit better. Twice a week nw.  Very compliant with HEP About 60-70% better Swimming, about 50% volume, then doing water in the pool.  Some abd pain.  Now able to do freestyle and backstroke  Patient Active Problem List   Diagnosis Date Noted  . Elevated BP 03/05/2012    Past Medical History  Diagnosis Date  . History of post-polio syndrome 1975    No residual weakness    No past surgical history on file.  History   Social History  . Marital Status: Single    Spouse Name: N/A    Number of Children: 0  . Years of Education: N/A   Occupational History  . 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 Smoking status: Former Games developer  . Smokeless tobacco: Never Used  . Alcohol Use: Yes     Comment:  one beer a night  . Drug Use: No  . Sexual Activity: Not on file   Other Topics Concern  . Not on file   Social History Narrative   Active cyclist, swimmer   Exercises 12 hours a week. Swims 1 mile at a time, 1 hour twice a week    Family History  Problem Relation Age of Onset  . Heart failure Father     No Known Allergies  Medication list has been reviewed and updated.  Outpatient Prescriptions Prior to Visit  Medication Sig Dispense Refill  . aspirin 81 MG tablet Take 81 mg by mouth daily.       . Multiple Vitamin (MULTIVITAMIN PO) Take 1 tablet by mouth daily.         No facility-administered medications prior to visit.    Review of Systems:   GEN: No fevers, chills. Nontoxic. Primarily MSK  c/o today. MSK: Detailed in the HPI GI: tolerating PO intake without difficulty Neuro: No numbness, parasthesias, or tingling associated. Otherwise the pertinent positives of the ROS are noted above.    Physical Examination: BP 110/70  Pulse 76  Temp(Src) 97.9 F (36.6 C) (Oral)  Ht 5\' 5"  (1.651 m)  Wt 130 lb (58.968 kg)  BMI 21.63 kg/m2  Ideal Body Weight: Weight in (lb) to have BMI = 25: 149.9   GEN: WDWN, NAD, Non-toxic, Alert & Oriented x 3 HEENT: Atraumatic, Normocephalic.  Ears and Nose: No external deformity. EXTR: No clubbing/cyanosis/edema NEURO: Normal gait.  PSYCH: Normally interactive. Conversant. Not depressed or anxious appearing.  Calm demeanor.   Shoulder: r Inspection: No muscle wasting or winging Ecchymosis/edema: neg  AC joint, scapula, clavicle: NT Cervical spine: NT, full ROM Spurling's: neg Abduction: full, 5/5 Flexion: full, 5/5 IR, full, lift-off: 5/5 - at 90 deg abd, 40 deg decrease compared to contralateral side ER at neutral: full, 5/5 AC crossover and compression: neg Neer: neg Hawkins: very mild impingement Drop Test: neg Empty Can: neg Supraspinatus insertion: NT Bicipital groove: NT Speed's: neg Yergason's: neg Sulcus sign: neg Scapular dyskinesis: none C5-T1 intact Sensation intact Grip 5/5   Assessment and  Plan: Rotator cuff tendonitis, right  Shoulder pain, acute, unspecified laterality   Doing much better Cont rehab  i think some pain from GIRD -- keep up ROM, work on Engineer, building services  F/u prn  Signed,  Karleen Hampshire T. Prynce Jacober, MD

## 2013-03-06 ENCOUNTER — Encounter: Payer: Self-pay | Admitting: Family Medicine

## 2013-03-06 ENCOUNTER — Ambulatory Visit (INDEPENDENT_AMBULATORY_CARE_PROVIDER_SITE_OTHER): Payer: BC Managed Care – PPO | Admitting: Family Medicine

## 2013-03-06 VITALS — BP 128/80 | HR 65 | Temp 97.7°F | Ht 65.0 in | Wt 130.0 lb

## 2013-03-06 DIAGNOSIS — M67919 Unspecified disorder of synovium and tendon, unspecified shoulder: Secondary | ICD-10-CM

## 2013-03-06 DIAGNOSIS — M7541 Impingement syndrome of right shoulder: Secondary | ICD-10-CM

## 2013-03-06 DIAGNOSIS — M25819 Other specified joint disorders, unspecified shoulder: Secondary | ICD-10-CM

## 2013-03-06 DIAGNOSIS — M25511 Pain in right shoulder: Secondary | ICD-10-CM

## 2013-03-06 DIAGNOSIS — M25519 Pain in unspecified shoulder: Secondary | ICD-10-CM

## 2013-03-06 DIAGNOSIS — M7581 Other shoulder lesions, right shoulder: Secondary | ICD-10-CM

## 2013-03-06 NOTE — Progress Notes (Signed)
Pre-visit discussion using our clinic review tool. No additional management support is needed unless otherwise documented below in the visit note.  

## 2013-03-06 NOTE — Progress Notes (Signed)
Nature conservation officer at [facility] [address] [place] [address] Phone: [phone] Fax: [phone]  Date:  03/06/2013   Name:  Danny Cameron   DOB:  12/12/1950   MRN:  [id number] Gender: male Age: 62 y.o.  Primary Physician:  Hannah Beat, MD   Chief Complaint: Shoulder Pain   History of Present Illness:  Danny Cameron is a 62 y.o. pleasant patient who presents with the following:  F/u R shoulder: history is significant for what was likely a full-thickness versus high-grade partial-thickness tear approximately 20 years ago managed conservatively. This was on the RIGHT shoulder. It was never MRI or surgical improvement, but he had a great deal of difficulty elevating his arm, and it took him over a year to get back to full function.  Swimming, 1500 yards twice a week.  Sometimes biking. Still bothers a little lifting over his head.   Doing a bit better. Twice a week nw.  Very compliant with HEP About 80% better Swimming, then doing water in the pool.  Patient Active Problem List   Diagnosis Date Noted  . Elevated BP 03/05/2012    Past Medical History  Diagnosis Date  . History of post-polio syndrome 1975    No residual weakness    No past surgical history on file.  History   Social History  . Marital Status: Single    Spouse Name: N/A    Number of Children: 0  . Years of Education: N/A   Occupational History  . 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 Smoking status: Former Games developer  . Smokeless tobacco: Never Used  . Alcohol Use: Yes     Comment:  one beer a night  . Drug Use: No  . Sexual Activity: Not on file   Other Topics Concern  . Not on file   Social History Narrative   Active cyclist, swimmer   Exercises 12 hours a week. Swims 1 mile at a time, 1 hour twice a week    Family History  Problem Relation Age of Onset  . Heart failure Father     No Known Allergies  Medication list has been  reviewed and updated.  Outpatient Prescriptions Prior to Visit  Medication Sig Dispense Refill  . aspirin 81 MG tablet Take 81 mg by mouth daily.       . Multiple Vitamin (MULTIVITAMIN PO) Take 1 tablet by mouth daily.         No facility-administered medications prior to visit.    Review of Systems:   GEN: No fevers, chills. Nontoxic. Primarily MSK c/o today. MSK: Detailed in the HPI GI: tolerating PO intake without difficulty Neuro: No numbness, parasthesias, or tingling associated. Otherwise the pertinent positives of the ROS are noted above.    Physical Examination: BP 128/80  Pulse 65  Temp(Src) 97.7 F (36.5 C) (Oral)  Ht 5\' 5"  (1.651 m)  Wt 130 lb (58.968 kg)  BMI 21.63 kg/m2  Ideal Body Weight: Weight in (lb) to have BMI = 25: 149.9   GEN: WDWN, NAD, Non-toxic, Alert & Oriented x 3 HEENT: Atraumatic, Normocephalic.  Ears and Nose: No external deformity. EXTR: No clubbing/cyanosis/edema NEURO: Normal gait.  PSYCH: Normally interactive. Conversant. Not depressed or anxious appearing.  Calm demeanor.   Shoulder: r Inspection: No muscle wasting or winging Ecchymosis/edema: neg  AC joint, scapula, clavicle: NT Cervical spine: NT, full ROM Spurling's: neg Abduction: full, 4/5 Flexion: full, 5/5 IR, full, lift-off: 5/5 -  at 90 deg abd, 50 deg decrease compared to contralateral side ER at neutral: full, 5/5 AC crossover and compression: neg Neer: pos Hawkins: very mild impingement Drop Test: neg Empty Can: neg Supraspinatus insertion: NT Bicipital groove: NT Speed's: neg Yergason's: neg Sulcus sign: neg Scapular dyskinesis: none C5-T1 intact Sensation intact Grip 5/5   Assessment and Plan: Impingement syndrome, shoulder, right  Right shoulder pain - Plan: DG Shoulder Right  Rotator cuff tendonitis, right   i still think some pain from GIRD -- keep up ROM, work on Engineer, building services  We talked about various treatment options. He does have some  weakness in supraspinatus. With his prior history, he could have a chronic versus acute rotator cuff tear, full thickness versus high-grade partial-thickness tear. I did the best to go over this in detail. He does not want to have any cavus surgery at all. He is still highly functional and is swimming 1500 m several times a week, despite some limitations. He is highly informed, and was to do rehabilitation on his own. I discussed formal physical therapy, and this is often helpful in rotator cuff rehabilitation, but he prefers to do this on his own, which I think is reasonable in this case.  I added some additional posterior capsular stretches from [facility].  We also elected to do a combined intra-articular and subacromial corticosteroid injection. Hopefully this will hasten his rehabilitation  F/u 2 mo  SubAC Injection, R Verbal consent was obtained from the patient. Risks (including rare infection), benefits, and alternatives were explained. Patient prepped with Chloraprep and Ethyl Chloride used for anesthesia. The subacromial space was injected using the posterior approach. The patient tolerated the procedure well and had decreased pain post injection. No complications. Injection: 4 cc of Lidocaine 1% and 1 cc of Depo-Medrol 40 mg. Needle: 22 gauge   Intrarticular Shoulder Injection, R Verbal consent was obtained from the patient. Risks including infection explained and contrasted with benefits and alternatives. Patient prepped with Chloraprep and Ethyl Chloride used for anesthesia. An intraarticular shoulder injection was performed using the posterior approach. The patient tolerated the procedure well and had decreased pain post injection. No complications. Injection: 4 cc of Lidocaine 1% and 1 cc of Depo-Medrol 40 mg. Needle: 22 gauge   Signed,  Lanice Folden T. Pradeep Beaubrun, MD

## 2013-03-07 DIAGNOSIS — G14 Postpolio syndrome: Secondary | ICD-10-CM | POA: Insufficient documentation

## 2013-07-28 ENCOUNTER — Telehealth: Payer: Self-pay | Admitting: Family Medicine

## 2013-07-28 NOTE — Telephone Encounter (Signed)
Opened in error

## 2013-10-23 ENCOUNTER — Other Ambulatory Visit (INDEPENDENT_AMBULATORY_CARE_PROVIDER_SITE_OTHER): Payer: BC Managed Care – PPO

## 2013-10-23 DIAGNOSIS — E785 Hyperlipidemia, unspecified: Secondary | ICD-10-CM

## 2013-10-23 DIAGNOSIS — R030 Elevated blood-pressure reading, without diagnosis of hypertension: Secondary | ICD-10-CM

## 2013-10-23 DIAGNOSIS — Z0000 Encounter for general adult medical examination without abnormal findings: Secondary | ICD-10-CM

## 2013-10-23 DIAGNOSIS — IMO0001 Reserved for inherently not codable concepts without codable children: Secondary | ICD-10-CM

## 2013-10-23 DIAGNOSIS — Z125 Encounter for screening for malignant neoplasm of prostate: Secondary | ICD-10-CM

## 2013-10-23 DIAGNOSIS — Z79899 Other long term (current) drug therapy: Secondary | ICD-10-CM

## 2013-10-23 LAB — CBC WITH DIFFERENTIAL/PLATELET
BASOS PCT: 0.3 % (ref 0.0–3.0)
Basophils Absolute: 0 10*3/uL (ref 0.0–0.1)
EOS ABS: 0.6 10*3/uL (ref 0.0–0.7)
Eosinophils Relative: 8.1 % — ABNORMAL HIGH (ref 0.0–5.0)
HCT: 45.8 % (ref 39.0–52.0)
HEMOGLOBIN: 15.6 g/dL (ref 13.0–17.0)
LYMPHS PCT: 18.9 % (ref 12.0–46.0)
Lymphs Abs: 1.3 10*3/uL (ref 0.7–4.0)
MCHC: 33.9 g/dL (ref 30.0–36.0)
MCV: 97.4 fl (ref 78.0–100.0)
Monocytes Absolute: 0.6 10*3/uL (ref 0.1–1.0)
Monocytes Relative: 9.1 % (ref 3.0–12.0)
NEUTROS PCT: 63.6 % (ref 43.0–77.0)
Neutro Abs: 4.5 10*3/uL (ref 1.4–7.7)
Platelets: 177 10*3/uL (ref 150.0–400.0)
RBC: 4.71 Mil/uL (ref 4.22–5.81)
RDW: 12.6 % (ref 11.5–15.5)
WBC: 7 10*3/uL (ref 4.0–10.5)

## 2013-10-23 LAB — LIPID PANEL
CHOL/HDL RATIO: 4
Cholesterol: 184 mg/dL (ref 0–200)
HDL: 51.8 mg/dL (ref >39.00)
LDL Cholesterol: 115 mg/dL — ABNORMAL HIGH (ref 0–99)
NONHDL: 132.2
Triglycerides: 85 mg/dL (ref 0.0–149.0)
VLDL: 17 mg/dL (ref 0.0–40.0)

## 2013-10-23 LAB — HEPATIC FUNCTION PANEL
ALT: 20 U/L (ref 0–53)
AST: 22 U/L (ref 0–37)
Albumin: 3.9 g/dL (ref 3.5–5.2)
Alkaline Phosphatase: 57 U/L (ref 39–117)
BILIRUBIN DIRECT: 0.2 mg/dL (ref 0.0–0.3)
TOTAL PROTEIN: 6.4 g/dL (ref 6.0–8.3)
Total Bilirubin: 1.2 mg/dL (ref 0.2–1.2)

## 2013-10-23 LAB — BASIC METABOLIC PANEL
BUN: 15 mg/dL (ref 6–23)
CO2: 30 meq/L (ref 19–32)
Calcium: 8.9 mg/dL (ref 8.4–10.5)
Chloride: 104 mEq/L (ref 96–112)
Creatinine, Ser: 0.8 mg/dL (ref 0.4–1.5)
GFR: 103.91 mL/min (ref >60.00)
Glucose, Bld: 95 mg/dL (ref 70–99)
Potassium: 4.2 mEq/L (ref 3.5–5.1)
SODIUM: 138 meq/L (ref 135–145)

## 2013-10-23 LAB — PSA: PSA: 0.8 ng/mL (ref 0.10–4.00)

## 2013-10-30 ENCOUNTER — Encounter: Payer: Self-pay | Admitting: Family Medicine

## 2013-10-30 ENCOUNTER — Ambulatory Visit (INDEPENDENT_AMBULATORY_CARE_PROVIDER_SITE_OTHER): Payer: BC Managed Care – PPO | Admitting: Family Medicine

## 2013-10-30 VITALS — BP 125/75 | HR 71 | Temp 98.4°F | Ht 63.78 in | Wt 134.5 lb

## 2013-10-30 DIAGNOSIS — Z0000 Encounter for general adult medical examination without abnormal findings: Secondary | ICD-10-CM

## 2013-10-30 NOTE — Progress Notes (Signed)
[place] [address] Phone: [phone] Fax: [phone]  Patient ID: Danny Cameron MRN: [id number], DOB: 07/13/1950, 63 y.o. Date of Encounter: 10/30/2013  Primary Physician:  Owens Loffler, MD   Chief Complaint: Annual Exam   Subjective:   History of Present Illness:  Danny Cameron is a 63 y.o. pleasant patient who presents with the following:  Preventative Health Maintenance Visit:  Health Maintenance Summary Reviewed and updated, unless pt declines services.  Tobacco History Reviewed. Alcohol: No concerns, no excessive use Exercise Habits: Some activity, rec at least 30 mins 5 times a week STD concerns: no risk or activity to increase risk Drug Use: None Encouraged self-testicular check  BP: friend just died Under 130, 43-80.    Health Maintenance  Topic Date Due  . Tetanus/tdap  03/22/1970  . Influenza Vaccine  10/28/2013  . Colonoscopy  06/14/2019  . Zostavax  Completed    Immunization History  Administered Date(s) Administered  . Zoster 11/17/2011    Patient Active Problem List   Diagnosis Date Noted  . Post-polio syndrome, no weakness 03/07/2013  . Elevated BP 03/05/2012   Past Medical History  Diagnosis Date  . History of post-polio syndrome 1975    No residual weakness  . Hepatitis A     years ago   No past surgical history on file. History   Social History  . Marital Status: Single    Spouse Name: N/A    Number of Children: 0  . Years of Education: N/A   Occupational History  . 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 Smoking status: Former Research scientist (life sciences)  . Smokeless tobacco: Never Used  . Alcohol Use: Yes     Comment:  one beer a night  . Drug Use: No  . Sexual Activity: Not on file   Other Topics Concern  . Not on file   Social History Narrative   Active cyclist, swimmer   Exercises 12 hours a week. Swims 1 mile at a time, 1 hour twice a week   Family History  Problem  Relation Age of Onset  . Heart failure Father    No Known Allergies  Medication list has been reviewed and updated.  Review of Systems:  General: Denies fever, chills, sweats. No significant weight loss. Eyes: Denies blurring,significant itching ENT: Denies earache, sore throat, and hoarseness. Cardiovascular: Denies chest pains, palpitations, dyspnea on exertion Respiratory: Denies cough, dyspnea at rest,wheeezing Breast: no concerns about lumps GI: Denies nausea, vomiting, diarrhea, constipation, change in bowel habits, abdominal pain, melena, hematochezia GU: Denies penile discharge, ED, urinary flow / outflow problems. No STD concerns. Musculoskeletal: Denies back pain, joint pain Derm: Denies rash, itching Neuro: Denies  paresthesias, frequent falls, frequent headaches Psych: Denies depression, anxiety Endocrine: Denies cold intolerance, heat intolerance, polydipsia Heme: Denies enlarged lymph nodes Allergy: No hayfever  Objective:   Physical Examination: BP 125/75  Pulse 71  Temp(Src) 98.4 F (36.9 C) (Oral)  Ht 5' 3.78" (1.62 m)  Wt 134 lb 8 oz (61.009 kg)  BMI 23.25 kg/m2 Ideal Body Weight: Weight in (lb) to have BMI = 25: 144.3  No exam data present  GEN: well developed, well nourished, no acute distress Eyes: conjunctiva and lids normal, PERRLA, EOMI ENT: TM clear, nares clear, oral exam WNL Neck: supple, no lymphadenopathy, no thyromegaly, no JVD Pulm: clear to auscultation and percussion, respiratory effort normal CV: regular rate and rhythm, S1-S2, no murmur, rub or gallop, no bruits, peripheral  pulses normal and symmetric, no cyanosis, clubbing, edema or varicosities GI: soft, non-tender; no hepatosplenomegaly, masses; active bowel sounds all quadrants GU: no hernia, testicular mass, penile discharge Lymph: no cervical, axillary or inguinal adenopathy MSK: gait normal, muscle tone and strength WNL, no joint swelling, effusions, discoloration, crepitus    SKIN: clear, good turgor, color WNL, no rashes, lesions, or ulcerations Neuro: normal mental status, normal strength, sensation, and motion Psych: alert; oriented to person, place and time, normally interactive and not anxious or depressed in appearance.  All labs reviewed with patient.  Lipids:    Component Value Date/Time   CHOL 184 10/23/2013 0840   TRIG 85.0 10/23/2013 0840   HDL 51.80 10/23/2013 0840   VLDL 17.0 10/23/2013 0840   CHOLHDL 4 10/23/2013 0840   CBC: CBC Latest Ref Rng 10/23/2013  WBC 4.0 - 10.5 K/uL 7.0  Hemoglobin 13.0 - 17.0 g/dL 15.6  Hematocrit 39.0 - 52.0 % 45.8  Platelets 150.0 - 400.0 K/uL 161.0    Basic Metabolic Panel:    Component Value Date/Time   NA 138 10/23/2013 0840   K 4.2 10/23/2013 0840   CL 104 10/23/2013 0840   CO2 30 10/23/2013 0840   BUN 15 10/23/2013 0840   CREATININE 0.8 10/23/2013 0840   GLUCOSE 95 10/23/2013 0840   CALCIUM 8.9 10/23/2013 0840   Hepatic Function Latest Ref Rng 10/23/2013  Total Protein 6.0 - 8.3 g/dL 6.4  Albumin 3.5 - 5.2 g/dL 3.9  AST 0 - 37 U/L 22  ALT 0 - 53 U/L 20  Alk Phosphatase 39 - 117 U/L 57  Total Bilirubin 0.2 - 1.2 mg/dL 1.2  Bilirubin, Direct 0.0 - 0.3 mg/dL 0.2    No results found for this basename: TSH   Lab Results  Component Value Date   PSA 0.80 10/23/2013    Assessment & Plan:   Routine general medical examination at a health care facility   Health Maintenance Exam: The patient's preventative maintenance and recommended screening tests for an annual wellness exam were reviewed in full today. Brought up to date unless services declined.  Counselled on the importance of diet, exercise, and its role in overall health and mortality. The patient's FH and SH was reviewed, including their home life, tobacco status, and drug and alcohol status.  Doing great.  Follow-up: No Follow-up on file. Unless noted, follow-up in 1 year for Health Maintenance Exam.  New Prescriptions   No medications on  file   Discontinued Medications   No medications on file   Modified Medications   No medications on file   Signed,  Frederico Hamman T. Vansh Reckart, MD, [facility]  No orders of the defined types were placed in this encounter.   Current Medications at Discharge:   Medication List       This list is accurate as of: 10/30/13  9:26 AM.  Always use your most recent med list.               aspirin 81 MG tablet  Take 81 mg by mouth daily.     MULTIVITAMIN PO  Take 1 tablet by mouth daily.

## 2013-10-30 NOTE — Progress Notes (Signed)
Pre visit review using our clinic review tool, if applicable. No additional management support is needed unless otherwise documented below in the visit note. 

## 2015-06-19 ENCOUNTER — Encounter: Payer: Self-pay | Admitting: Family Medicine

## 2015-06-19 ENCOUNTER — Ambulatory Visit (INDEPENDENT_AMBULATORY_CARE_PROVIDER_SITE_OTHER): Payer: BLUE CROSS/BLUE SHIELD | Admitting: Family Medicine

## 2015-06-19 VITALS — BP 110/74 | HR 78 | Temp 97.7°F | Ht 63.5 in | Wt 131.5 lb

## 2015-06-19 DIAGNOSIS — B351 Tinea unguium: Secondary | ICD-10-CM | POA: Diagnosis not present

## 2015-06-19 MED ORDER — FLUCONAZOLE 150 MG PO TABS: ORAL_TABLET | ORAL | Status: DC

## 2015-06-19 NOTE — Progress Notes (Signed)
Dr. Karleen HampshireSpencer T. Jalesa Thien, MD, CAQ [facility] [address] [place], [id number] Phone: [phone] Fax: [phone]  06/19/2015  Patient: Danny Cameron, MRN: [id number], DOB: Dec 01, 1950, 65 y.o.  Primary Physician:  Hannah BeatSpencer Hason Ofarrell, MD   Chief Complaint  Patient presents with  . Nail Problem   Subjective:   Danny Cameron is a 65 y.o. very pleasant male patient who presents with the following:  Toenail fungus. B great toes  Also hit his L great toe recently  occ pain in stomach after eating  Past Medical History, Surgical History, Social History, Family History, Problem List, Medications, and Allergies have been reviewed and updated if relevant.  Patient Active Problem List   Diagnosis Date Noted  . Post-polio syndrome, no weakness 03/07/2013    Past Medical History  Diagnosis Date  . History of post-polio syndrome 1975    No residual weakness  . Hepatitis A     years ago    No past surgical history on file.  Social History   Social History  . Marital Status: Single    Spouse Name: N/A  . Number of Children: 0  . Years of Education: N/A   Occupational History  . 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 Smoking status: Former Games developermoker  . Smokeless tobacco: Never Used  . Alcohol Use: Yes     Comment:  one beer a night  . Drug Use: No  . Sexual Activity: Not on file   Other Topics Concern  . Not on file   Social History Narrative   Active cyclist, swimmer   Exercises 12 hours a week. Swims 1 mile at a time, 1 hour twice a week    Family History  Problem Relation Age of Onset  . Heart failure Father     No Known Allergies  Medication list reviewed and updated in full in [facility] [place] [facility].   GEN: No acute illnesses, no fevers, chills. GI: No n/v/d, eating normally Pulm: No SOB Interactive and getting along well at home.  Otherwise, ROS is as per the  HPI.  Objective:   BP 110/74 mmHg  Pulse 78  Temp(Src) 97.7 F (36.5 C) (Oral)  Ht 5' 3.5" (1.613 m)  Wt 131 lb 8 oz (59.648 kg)  BMI 22.93 kg/m2  GEN: WDWN, NAD, Non-toxic, A & O x 3 HEENT: Atraumatic, Normocephalic. Neck supple. No masses, No LAD. Ears and Nose: No external deformity. CV: RRR, No M/G/R. No JVD. No thrill. No extra heart sounds. PULM: CTA B, no wheezes, crackles, rhonchi. No retractions. No resp. distress. No accessory muscle use. EXTR: No c/c/e NEURO Normal gait.  PSYCH: Normally interactive. Conversant. Not depressed or anxious appearing.  Calm demeanor.   Toenail fungus B great toes  Laboratory and Imaging Data:  Assessment and Plan:   Onychomycosis due to dermatophyte  Diflucan for nail fungus  Follow-up: No Follow-up on file.  New Prescriptions   FLUCONAZOLE (DIFLUCAN) 150 MG TABLET    2 tabs po once weekly each week   Signed,  Jiali Linney T. Lyndia Bury, MD   Patient's Medications  New Prescriptions   FLUCONAZOLE (DIFLUCAN) 150 MG TABLET    2 tabs po once weekly each week  Previous Medications   ASPIRIN 81 MG TABLET    Take 81 mg by mouth daily.    MULTIPLE VITAMIN (MULTIVITAMIN PO)    Take 1 tablet by mouth daily.    Modified Medications  No medications on file  Discontinued Medications   No medications on file

## 2015-06-19 NOTE — Patient Instructions (Signed)
Either Zantac or Pepcid AC - can try if stomach discomfort

## 2015-06-19 NOTE — Progress Notes (Signed)
Pre visit review using our clinic review tool, if applicable. No additional management support is needed unless otherwise documented below in the visit note. 

## 2015-08-19 ENCOUNTER — Other Ambulatory Visit: Payer: Self-pay | Admitting: Family Medicine

## 2015-08-19 DIAGNOSIS — Z125 Encounter for screening for malignant neoplasm of prostate: Secondary | ICD-10-CM

## 2015-08-19 DIAGNOSIS — Z1159 Encounter for screening for other viral diseases: Secondary | ICD-10-CM

## 2015-08-19 DIAGNOSIS — Z79899 Other long term (current) drug therapy: Secondary | ICD-10-CM

## 2015-08-19 DIAGNOSIS — Z114 Encounter for screening for human immunodeficiency virus [HIV]: Secondary | ICD-10-CM

## 2015-08-19 DIAGNOSIS — Z13220 Encounter for screening for lipoid disorders: Secondary | ICD-10-CM

## 2015-08-21 ENCOUNTER — Other Ambulatory Visit (INDEPENDENT_AMBULATORY_CARE_PROVIDER_SITE_OTHER): Payer: BLUE CROSS/BLUE SHIELD

## 2015-08-21 DIAGNOSIS — Z79899 Other long term (current) drug therapy: Secondary | ICD-10-CM

## 2015-08-21 DIAGNOSIS — Z0000 Encounter for general adult medical examination without abnormal findings: Secondary | ICD-10-CM | POA: Diagnosis not present

## 2015-08-21 DIAGNOSIS — Z13220 Encounter for screening for lipoid disorders: Secondary | ICD-10-CM

## 2015-08-21 DIAGNOSIS — Z125 Encounter for screening for malignant neoplasm of prostate: Secondary | ICD-10-CM | POA: Diagnosis not present

## 2015-08-21 LAB — CBC WITH DIFFERENTIAL/PLATELET
Basophils Absolute: 0 10*3/uL (ref 0.0–0.1)
Basophils Relative: 0.6 % (ref 0.0–3.0)
EOS PCT: 7.4 % — AB (ref 0.0–5.0)
Eosinophils Absolute: 0.5 10*3/uL (ref 0.0–0.7)
HCT: 46.2 % (ref 39.0–52.0)
HEMOGLOBIN: 15.7 g/dL (ref 13.0–17.0)
Lymphocytes Relative: 19 % (ref 12.0–46.0)
Lymphs Abs: 1.3 10*3/uL (ref 0.7–4.0)
MCHC: 34 g/dL (ref 30.0–36.0)
MCV: 94.9 fl (ref 78.0–100.0)
MONOS PCT: 7.8 % (ref 3.0–12.0)
Monocytes Absolute: 0.5 10*3/uL (ref 0.1–1.0)
NEUTROS PCT: 65.2 % (ref 43.0–77.0)
Neutro Abs: 4.6 10*3/uL (ref 1.4–7.7)
Platelets: 196 10*3/uL (ref 150.0–400.0)
RBC: 4.87 Mil/uL (ref 4.22–5.81)
RDW: 12.7 % (ref 11.5–15.5)
WBC: 7 10*3/uL (ref 4.0–10.5)

## 2015-08-21 LAB — HEPATIC FUNCTION PANEL
ALT: 16 U/L (ref 0–53)
AST: 19 U/L (ref 0–37)
Albumin: 4.2 g/dL (ref 3.5–5.2)
Alkaline Phosphatase: 58 U/L (ref 39–117)
BILIRUBIN TOTAL: 1 mg/dL (ref 0.2–1.2)
Bilirubin, Direct: 0.2 mg/dL (ref 0.0–0.3)
TOTAL PROTEIN: 6.2 g/dL (ref 6.0–8.3)

## 2015-08-21 LAB — LIPID PANEL
CHOLESTEROL: 188 mg/dL (ref 0–200)
HDL: 45.6 mg/dL (ref >39.00)
LDL CALC: 129 mg/dL — AB (ref 0–99)
NonHDL: 142.59
Total CHOL/HDL Ratio: 4
Triglycerides: 70 mg/dL (ref 0.0–149.0)
VLDL: 14 mg/dL (ref 0.0–40.0)

## 2015-08-21 LAB — BASIC METABOLIC PANEL
BUN: 12 mg/dL (ref 6–23)
CO2: 29 mEq/L (ref 19–32)
Calcium: 8.9 mg/dL (ref 8.4–10.5)
Chloride: 105 mEq/L (ref 96–112)
Creatinine, Ser: 0.8 mg/dL (ref 0.40–1.50)
GFR: 103.31 mL/min (ref >60.00)
GLUCOSE: 97 mg/dL (ref 70–99)
POTASSIUM: 3.9 meq/L (ref 3.5–5.1)
SODIUM: 139 meq/L (ref 135–145)

## 2015-08-21 LAB — PSA: PSA: 0.81 ng/mL (ref 0.10–4.00)

## 2015-08-22 ENCOUNTER — Other Ambulatory Visit: Payer: Self-pay

## 2015-09-02 ENCOUNTER — Encounter: Payer: Self-pay | Admitting: Family Medicine

## 2015-09-05 ENCOUNTER — Ambulatory Visit (INDEPENDENT_AMBULATORY_CARE_PROVIDER_SITE_OTHER): Payer: BLUE CROSS/BLUE SHIELD | Admitting: Family Medicine

## 2015-09-05 ENCOUNTER — Encounter: Payer: Self-pay | Admitting: Family Medicine

## 2015-09-05 VITALS — BP 110/72 | HR 85 | Temp 97.6°F | Ht 63.5 in | Wt 129.2 lb

## 2015-09-05 DIAGNOSIS — Z0000 Encounter for general adult medical examination without abnormal findings: Secondary | ICD-10-CM

## 2015-09-05 MED ORDER — FLUCONAZOLE 150 MG PO TABS: ORAL_TABLET | ORAL | Status: DC

## 2015-09-05 NOTE — Progress Notes (Signed)
Pre visit review using our clinic review tool, if applicable. No additional management support is needed unless otherwise documented below in the visit note. 

## 2015-09-05 NOTE — Progress Notes (Signed)
Dr. Frederico Hamman T. Haizel Gatchell, MD, [facility] [place], [address] Phone: [phone] Fax: [phone]  09/05/2015  Patient: Danny Cameron, MRN: [id number], DOB: [birth year], 65 y.o.  Primary Physician:  Owens Loffler, MD   Chief Complaint  Patient presents with  . Annual Exam   Subjective:   Danny Cameron is a 65 y.o. pleasant patient who presents with the following:  Preventative Health Maintenance Visit:  Health Maintenance Summary Reviewed and updated, unless pt declines services.  Tobacco History Reviewed. Alcohol: No concerns, no excessive use Exercise Habits: 1-2 hours a day, 6 days a week STD concerns: no risk or activity to increase risk Drug Use: None Encouraged self-testicular check  Has been feeling kind of bad and spitting up some mucous.  Saw Dr. Humphrey Rolls about 4 years ago.   48 miles on the bike today.  Biking twice a week.   Mediterranean diet.   Health Maintenance  Topic Date Due  . Hepatitis C Screening  June 07, 1950  . HIV Screening  03/22/1966  . TETANUS/TDAP  03/22/1970  . INFLUENZA VACCINE  10/29/2015  . COLONOSCOPY  06/14/2019  . ZOSTAVAX  Completed   Immunization History  Administered Date(s) Administered  . Zoster 11/17/2011   Patient Active Problem List   Diagnosis Date Noted  . Post-polio syndrome, no weakness 03/07/2013   Past Medical History  Diagnosis Date  . History of post-polio syndrome 1975    No residual weakness  . Hepatitis A     years ago   No past surgical history on file. Social History   Social History  . Marital Status: Single    Spouse Name: N/A  . Number of Children: 0  . Years of Education: N/A   Occupational History  . 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 Smoking status: Former Research scientist (life sciences)  . Smokeless tobacco: Never Used  . Alcohol Use: Yes     Comment:  one beer a night  . Drug Use: No  . Sexual Activity:  Not on file   Other Topics Concern  . Not on file   Social History Narrative   Active cyclist, swimmer   Exercises 12 hours a week. Swims 1 mile at a time, 1 hour twice a week   Family History  Problem Relation Age of Onset  . Heart failure Father    No Known Allergies  Medication list has been reviewed and updated.   General: Denies fever, chills, sweats. No significant weight loss. Eyes: Denies blurring,significant itching ENT: Denies earache, sore throat, and hoarseness. Cardiovascular: Denies chest pains, palpitations, dyspnea on exertion Respiratory: Denies cough, dyspnea at rest,wheeezing Breast: no concerns about lumps GI: Denies nausea, vomiting, diarrhea, constipation, change in bowel habits, abdominal pain, melena, hematochezia GU: Denies penile discharge, ED, urinary flow / outflow problems. No STD concerns. Musculoskeletal: Denies back pain, joint pain Derm: Denies rash, itching Neuro: Denies  paresthesias, frequent falls, frequent headaches Psych: Denies depression, anxiety Endocrine: Denies cold intolerance, heat intolerance, polydipsia Heme: Denies enlarged lymph nodes Allergy: No hayfever  Objective:   BP 110/72 mmHg  Pulse 85  Temp(Src) 97.6 F (36.4 C) (Oral)  Ht 5' 3.5" (1.613 m)  Wt 129 lb 4 oz (58.627 kg)  BMI 22.53 kg/m2 Ideal Body Weight: Weight in (lb) to have BMI = 25: 143.1  No exam data present  GEN: well developed, well nourished, no acute distress Eyes: conjunctiva and lids normal, PERRLA, EOMI ENT:  TM clear, nares clear, oral exam WNL Neck: supple, no lymphadenopathy, no thyromegaly, no JVD Pulm: clear to auscultation and percussion, respiratory effort normal CV: regular rate and rhythm, S1-S2, no murmur, rub or gallop, no bruits, peripheral pulses normal and symmetric, no cyanosis, clubbing, edema or varicosities GI: soft, non-tender; no hepatosplenomegaly, masses; active bowel sounds all quadrants GU: no hernia, testicular mass,  penile discharge Lymph: no cervical, axillary or inguinal adenopathy MSK: gait normal, muscle tone and strength WNL, no joint swelling, effusions, discoloration, crepitus  SKIN: clear, good turgor, color WNL, no rashes, lesions, or ulcerations Neuro: normal mental status, normal strength, sensation, and motion Psych: alert; oriented to person, place and time, normally interactive and not anxious or depressed in appearance.  All labs reviewed with patient.  Lipids:    Component Value Date/Time   CHOL 188 08/21/2015 0806   TRIG 70.0 08/21/2015 0806   HDL 45.60 08/21/2015 0806   VLDL 14.0 08/21/2015 0806   CHOLHDL 4 08/21/2015 0806   CBC: CBC Latest Ref Rng 08/21/2015 10/23/2013  WBC 4.0 - 10.5 K/uL 7.0 7.0  Hemoglobin 13.0 - 17.0 g/dL 60.5 10.0  Hematocrit 41.7 - 52.0 % 46.2 45.8  Platelets 150.0 - 400.0 K/uL 196.0 177.0    Basic Metabolic Panel:    Component Value Date/Time   NA 139 08/21/2015 0806   K 3.9 08/21/2015 0806   CL 105 08/21/2015 0806   CO2 29 08/21/2015 0806   BUN 12 08/21/2015 0806   CREATININE 0.80 08/21/2015 0806   GLUCOSE 97 08/21/2015 0806   CALCIUM 8.9 08/21/2015 0806   Hepatic Function Latest Ref Rng 08/21/2015 10/23/2013  Total Protein 6.0 - 8.3 g/dL 6.2 6.4  Albumin 3.5 - 5.2 g/dL 4.2 3.9  AST 0 - 37 U/L 19 22  ALT 0 - 53 U/L 16 20  Alk Phosphatase 39 - 117 U/L 58 57  Total Bilirubin 0.2 - 1.2 mg/dL 1.0 1.2  Bilirubin, Direct 0.0 - 0.3 mg/dL 0.2 0.2    No results found for: TSH Lab Results  Component Value Date   PSA 0.81 08/21/2015   PSA 0.80 10/23/2013    Assessment and Plan:   Routine general medical examination at a health care facility  Health Maintenance Exam: The patient's preventative maintenance and recommended screening tests for an annual wellness exam were reviewed in full today. Brought up to date unless services declined.  Counselled on the importance of diet, exercise, and its role in overall health and mortality. The  patient's FH and SH was reviewed, including their home life, tobacco status, and drug and alcohol status.  Follow-up: No Follow-up on file. Unless noted, follow-up in 1 year for Health Maintenance Exam.  Modified Medications   Modified Medication Previous Medication   FLUCONAZOLE (DIFLUCAN) 150 MG TABLET fluconazole (DIFLUCAN) 150 MG tablet      2 tabs po once weekly each week    2 tabs po once weekly each week   Signed,  Berlene Dixson T. Dawan Farney, MD   Patient's Medications  New Prescriptions   No medications on file  Previous Medications   ASPIRIN 81 MG TABLET    Take 81 mg by mouth daily.    MULTIPLE VITAMIN (MULTIVITAMIN PO)    Take 1 tablet by mouth daily.    Modified Medications   Modified Medication Previous Medication   FLUCONAZOLE (DIFLUCAN) 150 MG TABLET fluconazole (DIFLUCAN) 150 MG tablet      2 tabs po once weekly each week    2 tabs po  once weekly each week  Discontinued Medications   No medications on file

## 2016-02-12 NOTE — Progress Notes (Signed)
Error made in ordering laboratories using wrong code.   Electronically Signed  By: Hannah BeatSpencer Ruari Mudgett, MD On: 02/12/2016 12:34 PM

## 2016-03-04 ENCOUNTER — Telehealth: Payer: Self-pay | Admitting: Family Medicine

## 2016-03-04 NOTE — Telephone Encounter (Signed)
[facility] - Client TELEPHONE ADVICE RECORD [facility]  Patient Name: Danny PortelaRICK Cameron  DOB: 12-Mar-1951    Initial Comment Caller states he isn't sure if he pulled a muscle in his stomach.    Nurse Assessment  Nurse: Dorthula RuePatten, RN, Enrique SackKendra Date/Time (Eastern Time): 03/04/2016 12:36:57 PM  Confirm and document reason for call. If symptomatic, describe symptoms. ---Caller states he has had a slight abdominal pain for about a week. He states he lifts weights, bikes and swims frequently. He is wondering if he pulled a muscle. Other than the slight pain he feels fine, no other symptoms. He states energy level is good. He states the pain is in the middle of his stomach.  Does the patient have any new or worsening symptoms? ---Yes  Will a triage be completed? ---Yes  Related visit to physician within the last 2 weeks? ---No  Does the PT have any chronic conditions? (i.e. diabetes, asthma, etc.) ---No  Is this a behavioral health or substance abuse call? ---No     Guidelines    Guideline Title Affirmed Question Affirmed Notes  Abdominal Pain - Male [1] MILD pain (e.g., does not interfere with normal activities) AND [2] pain comes and goes (cramps) [3] present > 48 hours    Final Disposition User   See Physician within 24 Hours Clyde HillPatten, RN, Enrique SackKendra    Comments  Scheduled with Dr Sampson SiBaity tomorrow 03/05/16 at 1030. The caller informed his insurance recently changed. Advised him to call the main line at the office and speak with them to make sure it was not an issue.   Referrals  REFERRED TO PCP OFFICE   Disagree/Comply: Comply

## 2016-03-04 NOTE — Telephone Encounter (Signed)
Noted  

## 2016-03-04 NOTE — Telephone Encounter (Signed)
Pt has appt with Pamala Hurry Baity NP on 03/05/16 at 10:30.

## 2016-03-05 ENCOUNTER — Ambulatory Visit: Payer: Self-pay | Admitting: Internal Medicine

## 2016-04-02 ENCOUNTER — Ambulatory Visit: Payer: Self-pay | Admitting: Family Medicine

## 2016-04-20 ENCOUNTER — Encounter: Payer: Self-pay | Admitting: Family Medicine

## 2016-04-20 ENCOUNTER — Ambulatory Visit (INDEPENDENT_AMBULATORY_CARE_PROVIDER_SITE_OTHER): Payer: Medicare HMO | Admitting: Family Medicine

## 2016-04-20 VITALS — BP 124/90 | HR 75 | Temp 98.1°F | Ht 63.5 in | Wt 134.2 lb

## 2016-04-20 DIAGNOSIS — Z23 Encounter for immunization: Secondary | ICD-10-CM | POA: Diagnosis not present

## 2016-04-20 DIAGNOSIS — Z7189 Other specified counseling: Secondary | ICD-10-CM

## 2016-04-20 DIAGNOSIS — Z0000 Encounter for general adult medical examination without abnormal findings: Secondary | ICD-10-CM

## 2016-04-20 NOTE — Progress Notes (Signed)
Dr. Frederico Hamman T. Annarae Macnair, MD, [facility] [place], [address] Phone: [phone] Fax: [phone]  04/20/2016  Patient: Danny Cameron, MRN: [id number], DOB: 06/18/1950, 66 y.o.  Primary Physician:  Owens Loffler, MD   Chief Complaint  Patient presents with  . Welcome to Medicare   Subjective:   Danny Cameron is a 66 y.o. pleasant patient who presents for a medicare wellness examination:  Welcome to Medicare Visit:  Health Maintenance Summary Reviewed and updated, unless pt declines services.  Tobacco History Reviewed. Alcohol: No concerns, no excessive use Exercise Habits: swimming biking - almost every day STD concerns: no risk or activity to increase risk Drug Use: None Encouraged self-testicular check  HIV - 1990 checked and negative Hepatitis C - wants to think about it  Tdap (medicare): wants to think about it Prevnar-13 Flu vaccine: declines  F/u colonoscopy. (no polyps found) Records reviewed which indicate a 5 year follow-up on paperwork, but patient recalls that he was verbally told that he should have a 10 year recall. Done at [facility].  Advanced directives - none now.   Health Maintenance  Topic Date Due  . Hepatitis C Screening  1951/01/22  . COLONOSCOPY  05/06/2015  . INFLUENZA VACCINE  06/27/2016 (Originally 10/29/2015)  . TETANUS/TDAP  04/21/2017 (Originally 03/22/1970)  . PNA vac Low Risk Adult (2 of 2 - PPSV23) 04/20/2017  . ZOSTAVAX  Completed  . HIV Screening  Addressed    Immunization History  Administered Date(s) Administered  . Pneumococcal Conjugate-13 04/20/2016  . Zoster 11/17/2011    Patient Active Problem List   Diagnosis Date Noted  . Post-polio syndrome, no weakness 03/07/2013   Past Medical History:  Diagnosis Date  . Hepatitis A    years ago  . History of post-polio syndrome 1975   No residual weakness   No past surgical history on  file. Social History   Social History  . Marital status: Single    Spouse name: N/A  . Number of children: 0  . Years of education: N/A   Occupational History  . 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 Smoking status: Former Research scientist (life sciences)  . Smokeless tobacco: Never Used  . Alcohol use Yes     Comment:  one beer a night  . Drug use: No  . Sexual activity: Not on file   Other Topics Concern  . Not on file   Social History Narrative   Active cyclist, swimmer   Exercises 12 hours a week. Swims 1 mile at a time, 1 hour twice a week   Family History  Problem Relation Age of Onset  . Heart failure Father    No Known Allergies  Medication list has been reviewed and updated.   General: Denies fever, chills, sweats. No significant weight loss. Eyes: Denies blurring,significant itching ENT: Denies earache, sore throat, and hoarseness. Cardiovascular: Denies chest pains, palpitations, dyspnea on exertion Respiratory: Denies cough, dyspnea at rest,wheeezing Breast: no concerns about lumps GI: Denies nausea, vomiting, diarrhea, constipation, change in bowel habits, abdominal pain, melena, hematochezia GU: Denies penile discharge, ED, urinary flow / outflow problems. No STD concerns. Musculoskeletal: Denies back pain, joint pain Derm: Denies rash, itching Neuro: Denies  paresthesias, frequent falls, frequent headaches Psych: Denies depression, anxiety Endocrine: Denies cold intolerance, heat intolerance, polydipsia Heme: Denies enlarged lymph nodes Allergy: No hayfever  Objective:   BP 124/90   Pulse 75   Temp 98.1  F (36.7 C) (Oral)   Ht 5' 3.5" (1.613 m)   Wt 134 lb 4 oz (60.9 kg)   BMI 23.41 kg/m   The patient completed a fall screen and PHQ-2 and PHQ-9 if necessary, which is documented in the EHR. The CMA/LPN/RN who assisted the patient verbally completed with them and documented results in [place].   Hearing Screening   Method:  Audiometry   125Hz 250Hz 500Hz 1000Hz 2000Hz 3000Hz 4000Hz 6000Hz 8000Hz  Right ear:   _0 Left ear:   20 40 40  20      Visual Acuity Screening   Right eye Left eye Both eyes  Without correction:     With correction: 20/20 20/15 20/20    GEN: well developed, well nourished, no acute distress Eyes: conjunctiva and lids normal, PERRLA, EOMI ENT: TM clear, nares clear, oral exam WNL Neck: supple, no lymphadenopathy, no thyromegaly, no JVD Pulm: clear to auscultation and percussion, respiratory effort normal CV: regular rate and rhythm, S1-S2, no murmur, rub or gallop, no bruits, peripheral pulses normal and symmetric, no cyanosis, clubbing, edema or varicosities GI: soft, non-tender; no hepatosplenomegaly, masses; active bowel sounds all quadrants GU: defer Lymph: no cervical, axillary or inguinal adenopathy MSK: gait normal, muscle tone and strength WNL, no joint swelling, effusions, discoloration, crepitus  SKIN: clear, good turgor, color WNL, no rashes, lesions, or ulcerations Neuro: normal mental status, normal strength, sensation, and motion Psych: alert; oriented to person, place and time, normally interactive and not anxious or depressed in appearance.  All labs reviewed with patient.  Lipids:    Component Value Date/Time   CHOL 188 08/21/2015 0806   TRIG 70.0 08/21/2015 0806   HDL 45.60 08/21/2015 0806   VLDL 14.0 08/21/2015 0806   CHOLHDL 4 08/21/2015 0806   CBC: CBC Latest Ref Rng & Units 08/21/2015 10/23/2013  WBC 4.0 - 10.5 K/uL 7.0 7.0  Hemoglobin 13.0 - 17.0 g/dL 15.7 15.6  Hematocrit 39.0 - 52.0 % 46.2 45.8  Platelets 150.0 - 400.0 K/uL 196.0 226.3    Basic Metabolic Panel:    Component Value Date/Time   NA 139 08/21/2015 0806   K 3.9 08/21/2015 0806   CL 105 08/21/2015 0806   CO2 29 08/21/2015 0806   BUN 12 08/21/2015 0806   CREATININE 0.80 08/21/2015 0806   GLUCOSE 97 08/21/2015 0806   CALCIUM 8.9 08/21/2015 0806   Hepatic Function  Latest Ref Rng & Units 08/21/2015 10/23/2013  Total Protein 6.0 - 8.3 g/dL 6.2 6.4  Albumin 3.5 - 5.2 g/dL 4.2 3.9  AST 0 - 37 U/L 19 22  ALT 0 - 53 U/L 16 20  Alk Phosphatase 39 - 117 U/L 58 57  Total Bilirubin 0.2 - 1.2 mg/dL 1.0 1.2  Bilirubin, Direct 0.0 - 0.3 mg/dL 0.2 0.2    No results found for: TSH Lab Results  Component Value Date   PSA 0.81 08/21/2015   PSA 0.80 10/23/2013    Assessment and Plan:   Healthcare maintenance  Need for prophylactic vaccination against Streptococcus pneumoniae (pneumococcus) - Plan: Pneumococcal conjugate vaccine 13-valent IM  Advanced directives, counseling/discussion  Health Maintenance Exam: The patient's preventative maintenance and recommended screening tests for Welcome to Medicare were reviewed in full today. Brought up to date unless services declined.  Counselled on the importance of diet, exercise, and its role in overall health and mortality. The patient's FH and SH was reviewed, including their home life,  tobacco status, and drug and alcohol status.  Follow-up in 1 year for physical exam or additional follow-up below.  I have personally reviewed the Medicare Annual Wellness questionnaire and have noted 1. The patient's medical and social history 2. Their use of alcohol, tobacco or illicit drugs 3. Their current medications and supplements 4. The patient's functional ability including ADL's, fall risks, home safety risks and hearing or visual             impairment. 5. Diet and physical activities 6. Evidence for depression or mood disorders 7. Reviewed Updated provider list, see scanned forms and [facility] Snapshot.   The patients weight, height, BMI and visual acuity have been recorded in the chart I have made referrals, counseling and provided education to the patient based review of the above and I have provided the pt with a written personalized care plan for preventive services.  I have provided the patient with a copy of  your personalized plan for preventive services. Instructed to take the time to review along with their updated medication list.  Patient Instructions  Follow-up a colonoscopy - check with your records.   Think about pneumonia vaccine.   Get the healthcare power of attorney / living will documents  All other screening tests are up to date    Follow-up: No Follow-up on file. Or follow-up in 1 year if not noted.  Orders Placed This Encounter  Procedures  . Pneumococcal conjugate vaccine 13-valent IM    Signed,  Amarachukwu Lakatos T. Ikaika Showers, MD   Allergies as of 04/20/2016   No Known Allergies     Medication List       Accurate as of 04/20/16 11:59 PM. Always use your most recent med list.          aspirin 81 MG tablet Take 81 mg by mouth daily.   MULTIVITAMIN PO Take 1 tablet by mouth daily.

## 2016-04-20 NOTE — Progress Notes (Signed)
Pre visit review using our clinic review tool, if applicable. No additional management support is needed unless otherwise documented below in the visit note. 

## 2016-04-20 NOTE — Patient Instructions (Addendum)
Follow-up a colonoscopy - check with your records.   Think about pneumonia vaccine.   Get the healthcare power of attorney / living will documents  All other screening tests are up to date

## 2016-04-21 ENCOUNTER — Telehealth: Payer: Self-pay

## 2016-04-21 NOTE — Telephone Encounter (Signed)
noted 

## 2016-04-21 NOTE — Telephone Encounter (Signed)
Pt left v/m; pt was seen 04/20/16; the doctor that noted pt to have colonoscopy in 5 years even though no polyps seen is no longer in practice where pt saw him last. The nurse could not explain why the doctor advised pt to have a colonoscopy in 5 years. Pt may have colonoscopy next year. Pt said the shot worked well;the arm does not hurt any more. FYI to Dr Patsy Lageropland.

## 2016-04-28 ENCOUNTER — Telehealth: Payer: Self-pay | Admitting: Family Medicine

## 2016-04-28 NOTE — Telephone Encounter (Signed)
Patient Name: Danny Cameron  Gender: Male  DOB: Aug 23, 1950   Age: [age] Y 1 M 6 D  Return Phone Number: [phone] (Primary)  Address:   City/State/Zip: [place] [id number]   Client [facility] - Client  Client [facility]  Physician Copland, Karleen HampshireSpencer - MD  Contact Type Call  Who Is Calling Patient / Member / Family / Caregiver  Call Type Triage / Clinical  Relationship To Patient Self  Return Phone Number [phone] (Primary)  Chief Complaint Cuts and Lacerations  Reason for Call Symptomatic / Request for Health Information  Initial Comment He cut himself on a rusty wire last week and he knows he was due for a tetanus shot. He wants to know if this qualifies for him to get checked for Medicare?   Appointment Disposition EMR Caller Not Reached  Translation No   Nurse Assessment      Guidelines      Guideline Title Affirmed Question Affirmed Notes Nurse Date/Time (Eastern Time)         Disp. Time Lamount Cohen(Eastern Time) Disposition Final User   04/28/2016 2:07:24 PM Attempt made - no message left  Standifer, RN, Herbert SetaHeather

## 2016-04-28 NOTE — Telephone Encounter (Signed)
Unable to reach pt by phone and left v/m on home # for pt to cb.

## 2016-04-28 NOTE — Telephone Encounter (Signed)
Patient Name: Deniece PortelaRICK Rawlinson  DOB: [birth year]    Initial Comment He cut himself on a rusty wire last week and he knows he was due for a tetanus shot. He wants to know if this qualifies for him to get checked for Medicare?    Nurse Assessment      Guidelines    Guideline Title Affirmed Question Affirmed Notes       Final Disposition User   FINAL ATTEMPT MADE - no message left Standifer, RN, Avery DennisonHeather

## 2016-05-01 NOTE — Telephone Encounter (Signed)
Pt was not at home but spoke with Danny BurtonEmily (DPR signed). Advised if pt had not had tetanus shot in last 5 years should get a tetanus shot. Danny Cameron said no signs of infection and appears healed. Danny Cameron will let pt know he can cb and schedule tetanus shot or can get at [facility]. Danny Cameron will relay message and pt will cb if wants appt here. FYI to Dr Patsy Lageropland.

## 2016-06-16 DIAGNOSIS — H25813 Combined forms of age-related cataract, bilateral: Secondary | ICD-10-CM | POA: Diagnosis not present

## 2016-10-07 DIAGNOSIS — R69 Illness, unspecified: Secondary | ICD-10-CM | POA: Diagnosis not present

## 2016-10-20 ENCOUNTER — Telehealth: Payer: Self-pay

## 2016-10-20 NOTE — Telephone Encounter (Signed)
Pt wanted to know since had prevnar 13 on 04/20/16 when should pt get pneumovax. Advised pt needed to be at least 1 year in between the 2 immunizations. Pt said he would get the pneumovax when he comes in for his CPX. Nothing further needed.

## 2017-01-04 DIAGNOSIS — R69 Illness, unspecified: Secondary | ICD-10-CM | POA: Diagnosis not present

## 2017-03-17 DIAGNOSIS — R69 Illness, unspecified: Secondary | ICD-10-CM | POA: Diagnosis not present

## 2017-05-26 ENCOUNTER — Ambulatory Visit (INDEPENDENT_AMBULATORY_CARE_PROVIDER_SITE_OTHER): Payer: Medicare HMO | Admitting: *Deleted

## 2017-05-26 DIAGNOSIS — Z23 Encounter for immunization: Secondary | ICD-10-CM | POA: Diagnosis not present

## 2017-06-21 ENCOUNTER — Telehealth: Payer: Self-pay | Admitting: *Deleted

## 2017-06-21 NOTE — Telephone Encounter (Signed)
Spoke with Mr. Leanora CoverKarvasales.  He states he is hoping to start a new job with [employer] doing [place].  They are recommending that he gets the Hep B vaccine series.  He states he use to travel a lot and was diagnosed with Infectious Hepatitis back in 1975.  He states he got this from dirty water.  He is just wanting to make sure it is okay with Dr. Patsy Lageropland that he gets these vaccine.  I advised it is okay for him to get the Hepatitis B vaccines and should be able to get them for free at [facility].  I advised I would forward this message to Dr. Patsy Lageropland and if he says anything differently, I would call him back.

## 2017-06-21 NOTE — Telephone Encounter (Signed)
Hep b series should be fine. 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   Generally, people mean hepatitis A when they talk about infectious hepatitis. Hep B risk is more blood exposure risk.

## 2017-06-21 NOTE — Telephone Encounter (Signed)
Copied from CRM [phone]. 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

## 2017-08-18 ENCOUNTER — Ambulatory Visit: Payer: Self-pay | Admitting: *Deleted

## 2017-08-18 NOTE — Telephone Encounter (Signed)
Patient with mild SOB with swimming this week but not enough to stop the activity. He stated he has been feeling fatigues lately, but is very active. Stated he has been exposed to someone coughing and hacking, recently. Now feels like he has some chest congestion. Home advice care given per protocol including mucinex, increase fluids, adding a humidifier. Reviewed symptoms that would need further evaluation.   Answer Assessment - Initial Assessment Questions 1. ONSET: "When did the cough begin?"      None, chest congestion 2. SEVERITY: "How bad is the cough today?"       3. RESPIRATORY DISTRESS: "Describe your breathing."      Mild SOB with activity 4. FEVER: "Do you have a fever?" If so, ask: "What is your temperature, how was it measured, and when did it start?"     none 5. HEMOPTYSIS: "Are you coughing up any blood?" If so ask: "How much?" (flecks, streaks, tablespoons, etc.)     no 6. TREATMENT: "What have you done so far to treat the cough?" (e.g., meds, fluids, humidifier)     fluids 7. CARDIAC HISTORY: "Do you have any history of heart disease?" (e.g., heart attack, congestive heart failure)      no 8. LUNG HISTORY: "Do you have any history of lung disease?"  (e.g., pulmonary embolus, asthma, emphysema)     no 9. PE RISK FACTORS: "Do you have a history of blood clots?" (or: recent major surgery, recent prolonged travel, bedridden )     Flew from [place] on 08/10/17 10. OTHER SYMPTOMS: "Do you have any other symptoms? (e.g., runny nose, wheezing, chest pain)      no 11. PREGNANCY: "Is there any chance you are pregnant?" "When was your last menstrual period?"       na 12. TRAVEL: "Have you traveled out of the country in the last month?" (e.g., travel history, exposures)       flew from [place] on 08/10/17    This patient does not have a cough at this time. He feels fatigued with activity and mildly SOB with swimming this week but not enough to stop activity.  Protocols used:  COUGH - ACUTE NON-PRODUCTIVE-A-AH

## 2017-09-14 ENCOUNTER — Ambulatory Visit (INDEPENDENT_AMBULATORY_CARE_PROVIDER_SITE_OTHER): Payer: Medicare HMO

## 2017-09-14 VITALS — BP 112/90 | HR 79 | Temp 98.0°F | Ht 63.5 in | Wt 129.8 lb

## 2017-09-14 DIAGNOSIS — Z0000 Encounter for general adult medical examination without abnormal findings: Secondary | ICD-10-CM | POA: Diagnosis not present

## 2017-09-14 LAB — CBC WITH DIFFERENTIAL/PLATELET
BASOS PCT: 0.9 % (ref 0.0–3.0)
Basophils Absolute: 0.1 10*3/uL (ref 0.0–0.1)
EOS PCT: 5.4 % — AB (ref 0.0–5.0)
Eosinophils Absolute: 0.3 10*3/uL (ref 0.0–0.7)
HEMATOCRIT: 47 % (ref 39.0–52.0)
HEMOGLOBIN: 16.5 g/dL (ref 13.0–17.0)
LYMPHS PCT: 20.8 % (ref 12.0–46.0)
Lymphs Abs: 1.3 10*3/uL (ref 0.7–4.0)
MCHC: 35.2 g/dL (ref 30.0–36.0)
MCV: 94.2 fl (ref 78.0–100.0)
MONOS PCT: 7.9 % (ref 3.0–12.0)
Monocytes Absolute: 0.5 10*3/uL (ref 0.1–1.0)
Neutro Abs: 4.2 10*3/uL (ref 1.4–7.7)
Neutrophils Relative %: 65 % (ref 43.0–77.0)
Platelets: 195 10*3/uL (ref 150.0–400.0)
RBC: 4.98 Mil/uL (ref 4.22–5.81)
RDW: 12.6 % (ref 11.5–15.5)
WBC: 6.4 10*3/uL (ref 4.0–10.5)

## 2017-09-14 LAB — BASIC METABOLIC PANEL
BUN: 13 mg/dL (ref 6–23)
CALCIUM: 9.3 mg/dL (ref 8.4–10.5)
CO2: 31 mEq/L (ref 19–32)
Chloride: 102 mEq/L (ref 96–112)
Creatinine, Ser: 0.79 mg/dL (ref 0.40–1.50)
GFR: 104.15 mL/min (ref >60.00)
GLUCOSE: 90 mg/dL (ref 70–99)
POTASSIUM: 4 meq/L (ref 3.5–5.1)
SODIUM: 141 meq/L (ref 135–145)

## 2017-09-14 LAB — HEPATIC FUNCTION PANEL
ALT: 18 U/L (ref 0–53)
AST: 20 U/L (ref 0–37)
Albumin: 4.4 g/dL (ref 3.5–5.2)
Alkaline Phosphatase: 66 U/L (ref 39–117)
Bilirubin, Direct: 0.2 mg/dL (ref 0.0–0.3)
TOTAL PROTEIN: 6.9 g/dL (ref 6.0–8.3)
Total Bilirubin: 0.9 mg/dL (ref 0.2–1.2)

## 2017-09-14 LAB — LIPID PANEL
CHOLESTEROL: 195 mg/dL (ref 0–200)
HDL: 60.4 mg/dL (ref >39.00)
LDL Cholesterol: 122 mg/dL — ABNORMAL HIGH (ref 0–99)
NONHDL: 134.54
Total CHOL/HDL Ratio: 3
Triglycerides: 65 mg/dL (ref 0.0–149.0)
VLDL: 13 mg/dL (ref 0.0–40.0)

## 2017-09-14 LAB — PSA, MEDICARE: PSA: 1.06 ng/mL (ref 0.10–4.00)

## 2017-09-14 NOTE — Progress Notes (Signed)
PCP notes:   Health maintenance:  Hep C screening - pt declined Tetanus vaccine- postponed/insurance Colonoscopy - pt wants to discuss Cologuard  Abnormal screenings:   None  Patient concerns:   None  Nurse concerns:  None  Next PCP appt:   09/20/2017 @ 1440

## 2017-09-14 NOTE — Progress Notes (Signed)
Subjective:   Danny Cameron is a 67 y.o. male who presents for an Initial Medicare Annual Wellness Visit.  Review of Systems  N/A Cardiac Risk Factors include: advanced age (>77men, >76 women);male gender    Objective:    Today's Vitals   09/14/17 0815  BP: 112/90  Pulse: 79  Temp: 98 F (36.7 C)  TempSrc: Oral  SpO2: 98%  Weight: 129 lb 12 oz (58.9 kg)  Height: 5' 3.5" (1.613 m)  PainSc: 0-No pain   Body mass index is 22.62 kg/m.  Advanced Directives 09/14/2017  Does Patient Have a Medical Advance Directive? Yes  Type of Estate agent of [place];Living will  Copy of Healthcare Power of Attorney in Chart? Yes    Current Medications (verified) Outpatient Encounter Medications as of 09/14/2017  Medication Sig  . Multiple Vitamin (MULTIVITAMIN PO) Take 1 tablet by mouth daily.    . [DISCONTINUED] aspirin 81 MG tablet Take 81 mg by mouth daily.    No facility-administered encounter medications on file as of 09/14/2017.     Allergies (verified) Patient has no known allergies.   History: Past Medical History:  Diagnosis Date  . Hepatitis A    years ago  . History of post-polio syndrome 1975   No residual weakness   Past Surgical History:  Procedure Laterality Date  . COLONOSCOPY  05/2011   Family History  Problem Relation Age of Onset  . Heart failure Father    Social History   Socioeconomic History  . Marital status: Single    Spouse name: Not on file  . Number of children: 0  . Years of education: Not on file  . Highest education level: Not on file  Occupational History  . Occupation: Radiation protection practitioner: [employer]  Social Needs  . Financial resource strain: Not on file  . Food insecurity:    Worry: Not on file    Inability: Not on file  . Transportation needs:    Medical: Not on file    Non-medical: Not on file  Tobacco Use  . Smoking status: Former Games developer  . Smokeless tobacco: Never Used    Substance and Sexual Activity  . Alcohol use: Yes    Alcohol/week: 4.2 oz    Types: 7 Cans of beer per week    Comment:  one beer a night  . Drug use: No  . Sexual activity: Not on file  Lifestyle  . Physical activity:    Days per week: Not on file    Minutes per session: Not on file  . Stress: Not on file  Relationships  . Social connections:    Talks on phone: Not on file    Gets together: Not on file    Attends religious service: Not on file    Active member of club or organization: Not on file    Attends meetings of clubs or organizations: Not on file    Relationship status: Not on file  Other Topics Concern  . Not on file  Social History Narrative   Active cyclist, swimmer   Exercises 12 hours a week. Swims 1 mile at a time, 1 hour twice a week   Tobacco Counseling Counseling given: No   Clinical Intake:  Pre-visit preparation completed: Yes  Pain : No/denies pain Pain Score: 0-No pain     Nutritional Status: BMI of 19-24  Normal Nutritional Risks: None Diabetes: No  How often do you need to have someone help  you when you read instructions, pamphlets, or other written materials from your doctor or pharmacy?: 1 - Never What is the last grade level you completed in school?: 12th grade  Interpreter Needed?: No  Comments: pt lives with spouse Information entered by :: LPinson, LPN  Activities of Daily Living In your present state of health, do you have any difficulty performing the following activities: 09/14/2017  Hearing? N  Vision? N  Difficulty concentrating or making decisions? N  Walking or climbing stairs? N  Dressing or bathing? N  Doing errands, shopping? N  Preparing Food and eating ? N  Using the Toilet? N  In the past six months, have you accidently leaked urine? N  Do you have problems with loss of bowel control? N  Managing your Medications? N  Managing your Finances? N  Housekeeping or managing your Housekeeping? N  Some recent data  might be hidden     Immunizations and Health Maintenance Immunization History  Administered Date(s) Administered  . Influenza, High Dose Seasonal PF 01/04/2017  . Pneumococcal Conjugate-13 04/20/2016  . Pneumococcal Polysaccharide-23 05/26/2017  . Zoster 11/17/2011   There are no preventive care reminders to display for this patient.  Patient Care Team: Hannah Beat, MD as PCP - General (Family Medicine)  Indicate any recent Medical Services you may have received from other than [facility] providers in the past year (date may be approximate).    Assessment:   This is a routine wellness examination for Danny Cameron.  Hearing/Vision screen  Hearing Screening   125Hz  250Hz  500Hz  1000Hz  2000Hz  3000Hz  4000Hz  6000Hz  8000Hz   Right ear:   40 40 40  40    Left ear:   40 40 40  40      Visual Acuity Screening   Right eye Left eye Both eyes  Without correction:     With correction: 20/20-2 20/20-2 20/20-2    Dietary issues and exercise activities discussed: Current Exercise Habits: Home exercise routine, Type of exercise: Other - see comments;walking;strength training/weights;stretching(hiking, swimming, biking), Time (Minutes): > 60(11 hrs/wk), Frequency (Times/Week): 7, Weekly Exercise (Minutes/Week): 0, Intensity: Intense, Exercise limited by: None identified  Goals    . Increase physical activity     Starting 09/14/2017, I will continue to exercise for at least 11 hours weekly.       Depression Screen PHQ 2/9 Scores 09/14/2017 04/20/2016  PHQ - 2 Score 0 0  PHQ- 9 Score 0 -    Fall Risk Fall Risk  09/14/2017 04/20/2016  Falls in the past year? No No    Cognitive Function: MMSE - Mini Mental State Exam 09/14/2017  Orientation to time 5  Orientation to Place 5  Registration 3  Attention/ Calculation 0  Recall 3  Language- name 2 objects 0  Language- repeat 1  Language- follow 3 step command 3  Language- read & follow direction 0  Write a sentence 0  Copy design 0  Total  score 20       PLEASE NOTE: A Mini-Cog screen was completed. Maximum score is 20. A value of 0 denotes this part of Folstein MMSE was not completed or the patient failed this part of the Mini-Cog screening.   Mini-Cog Screening Orientation to Time - Max 5 pts Orientation to Place - Max 5 pts Registration - Max 3 pts Recall - Max 3 pts Language Repeat - Max 1 pts Language Follow 3 Step Command - Max 3 pts   Screening Tests Health Maintenance  Topic Date Due  .  COLONOSCOPY  09/15/2018 (Originally 05/06/2015)  . TETANUS/TDAP  09/15/2018 (Originally 03/22/1970)  . Hepatitis C Screening  09/14/2048 (Originally 04/04/1950)  . INFLUENZA VACCINE  10/28/2017  . PNA vac Low Risk Adult  Completed        Plan:     I have personally reviewed, addressed, and noted the following in the patient's chart:  A. Medical and social history B. Use of alcohol, tobacco or illicit drugs  C. Current medications and supplements D. Functional ability and status E.  Nutritional status F.  Physical activity G. Advance directives H. List of other physicians I.  Hospitalizations, surgeries, and ER visits in previous 12 months J.  Vitals K. Screenings to include hearing, vision, cognitive, depression L. Referrals and appointments - none  In addition, I have reviewed and discussed with patient certain preventive protocols, quality metrics, and best practice recommendations. A written personalized care plan for preventive services as well as general preventive health recommendations were provided to patient.  See attached scanned questionnaire for additional information.   Signed,   Randa EvensLesia Freddye Cardamone, MHA, BS, LPN Health Coach

## 2017-09-14 NOTE — Patient Instructions (Signed)
Mr. Leanora CoverKarvasales , Thank you for taking time to come for your Medicare Wellness Visit. I appreciate your ongoing commitment to your health goals. Please review the following plan we discussed and let me know if I can assist you in the future.   These are the goals we discussed: Goals    . Increase physical activity     Starting 09/14/2017, I will continue to exercise for at least 11 hours weekly.        This is a list of the screening recommended for you and due dates:  Health Maintenance  Topic Date Due  . Colon Cancer Screening  09/15/2018*  . Tetanus Vaccine  09/15/2018*  .  Hepatitis C: One time screening is recommended by Center for Disease Control  (CDC) for  adults born from [birth year] through 1965.   09/14/2048*  . Flu Shot  10/28/2017  . Pneumonia vaccines  Completed  *Topic was postponed. The date shown is not the original due date.   Preventive Care for Adults  A healthy lifestyle and preventive care can promote health and wellness. Preventive health guidelines for adults include the following key practices.  . A routine yearly physical is a good way to check with your health care provider about your health and preventive screening. It is a chance to share any concerns and updates on your health and to receive a thorough exam.  . Visit your dentist for a routine exam and preventive care every 6 months. Brush your teeth twice a day and floss once a day. Good oral hygiene prevents tooth decay and gum disease.  . The frequency of eye exams is based on your age, health, family medical history, use  of contact lenses, and other factors. Follow your health care provider's recommendations for frequency of eye exams.  . Eat a healthy diet. Foods like vegetables, fruits, whole grains, low-fat dairy products, and lean protein foods contain the nutrients you need without too many calories. Decrease your intake of foods high in solid fats, added sugars, and salt. Eat the right amount of  calories for you. Get information about a proper diet from your health care provider, if necessary.  . Regular physical exercise is one of the most important things you can do for your health. Most adults should get at least 150 minutes of moderate-intensity exercise (any activity that increases your heart rate and causes you to sweat) each week. In addition, most adults need muscle-strengthening exercises on 2 or more days a week.  Silver Sneakers may be a benefit available to you. To determine eligibility, you may visit the website: www.silversneakers.com or contact program at [phone] Mon-Fri between 8AM-8PM.   . Maintain a healthy weight. The body mass index (BMI) is a screening tool to identify possible weight problems. It provides an estimate of body fat based on height and weight. Your health care provider can find your BMI and can help you achieve or maintain a healthy weight.   For adults 20 years and older: ? A BMI below 18.5 is considered underweight. ? A BMI of 18.5 to 24.9 is normal. ? A BMI of 25 to 29.9 is considered overweight. ? A BMI of 30 and above is considered obese.   . Maintain normal blood lipids and cholesterol levels by exercising and minimizing your intake of saturated fat. Eat a balanced diet with plenty of fruit and vegetables. Blood tests for lipids and cholesterol should begin at age 67 and be repeated every 5 years. If your  lipid or cholesterol levels are high, you are over 50, or you are at high risk for heart disease, you may need your cholesterol levels checked more frequently. Ongoing high lipid and cholesterol levels should be treated with medicines if diet and exercise are not working.  . If you smoke, find out from your health care provider how to quit. If you do not use tobacco, please do not start.  . If you choose to drink alcohol, please do not consume more than 2 drinks per day. One drink is considered to be 12 ounces (355 mL) of beer, 5 ounces  (148 mL) of wine, or 1.5 ounces (44 mL) of liquor.  . If you are 70-48 years old, ask your health care provider if you should take aspirin to prevent strokes.  . Use sunscreen. Apply sunscreen liberally and repeatedly throughout the day. You should seek shade when your shadow is shorter than you. Protect yourself by wearing long sleeves, pants, a wide-brimmed hat, and sunglasses year round, whenever you are outdoors.  . Once a month, do a whole body skin exam, using a mirror to look at the skin on your back. Tell your health care provider of new moles, moles that have irregular borders, moles that are larger than a pencil eraser, or moles that have changed in shape or color.

## 2017-09-19 NOTE — Progress Notes (Signed)
Dr. Frederico Hamman T. Doryce Mcgregory, MD, [facility] [place], [address] Phone: [phone] Fax: [phone]  09/20/2017  Patient: Danny Cameron, MRN: [id number], DOB: Aug 26, 1950, 67 y.o.  Primary Physician:  Owens Loffler, MD   Chief Complaint  Patient presents with  . Annual Exam    Part 2   Subjective:   Danny Cameron is a 67 y.o. pleasant patient who presents with the following:  Preventative Health Maintenance Visit:  Health Maintenance Summary Reviewed and updated, unless pt declines services.  Tobacco History Reviewed. Alcohol: No concerns, no excessive use Exercise Habits: Daily activity, weights, pool, bike STD concerns: no risk or activity to increase risk Drug Use: None Encouraged self-testicular check  Overall he is doing well and eating well.   Health Maintenance  Topic Date Due  . COLONOSCOPY  09/15/2018 (Originally 05/06/2015)  . TETANUS/TDAP  09/15/2018 (Originally 03/22/1970)  . Hepatitis C Screening  09/14/2048 (Originally 20-Jun-1950)  . INFLUENZA VACCINE  10/28/2017  . PNA vac Low Risk Adult  Completed   Immunization History  Administered Date(s) Administered  . Influenza, High Dose Seasonal PF 01/04/2017  . Pneumococcal Conjugate-13 04/20/2016  . Pneumococcal Polysaccharide-23 05/26/2017  . Zoster 11/17/2011   Patient Active Problem List   Diagnosis Date Noted  . Post-polio syndrome, no weakness 03/07/2013   Past Medical History:  Diagnosis Date  . Hepatitis A    years ago  . History of post-polio syndrome 1975   No residual weakness   Past Surgical History:  Procedure Laterality Date  . COLONOSCOPY  05/2011   Social History   Socioeconomic History  . Marital status: Single    Spouse name: Not on file  . Number of children: 0  . Years of education: Not on file  . Highest education level: Not on file  Occupational History  . Occupation: Financial controller: [employer]  Social Needs  . Financial resource strain: Not on file  . Food insecurity:    Worry: Not on file    Inability: Not on file  . Transportation needs:    Medical: Not on file    Non-medical: Not on file  Tobacco Use  . Smoking status: Former Research scientist (life sciences)  . Smokeless tobacco: Never Used  Substance and Sexual Activity  . Alcohol use: Yes    Alcohol/week: 4.2 oz    Types: 7 Cans of beer per week    Comment:  one beer a night  . Drug use: No  . Sexual activity: Not on file  Lifestyle  . Physical activity:    Days per week: Not on file    Minutes per session: Not on file  . Stress: Not on file  Relationships  . Social connections:    Talks on phone: Not on file    Gets together: Not on file    Attends religious service: Not on file    Active member of club or organization: Not on file    Attends meetings of clubs or organizations: Not on file    Relationship status: Not on file  . Intimate partner violence:    Fear of current or ex partner: Not on file    Emotionally abused: Not on file    Physically abused: Not on file    Forced sexual activity: Not on file  Other Topics Concern  . Not on file  Social History Narrative   Active cyclist, swimmer   Exercises 12 hours a week.  Swims 1 mile at a time, 1 hour twice a week   Family History  Problem Relation Age of Onset  . Heart failure Father    No Known Allergies  Medication list has been reviewed and updated.   General: Denies fever, chills, sweats. No significant weight loss. Eyes: Denies blurring,significant itching ENT: Denies earache, sore throat, and hoarseness. Cardiovascular: Denies chest pains, palpitations, dyspnea on exertion Respiratory: Denies cough, dyspnea at rest,wheeezing Breast: no concerns about lumps GI: Denies nausea, vomiting, diarrhea, constipation, change in bowel habits, abdominal pain, melena, hematochezia GU: Denies penile discharge, ED, urinary flow / outflow problems. No STD  concerns. Musculoskeletal: Denies back pain, joint pain Derm: Denies rash, itching Neuro: Denies  paresthesias, frequent falls, frequent headaches Psych: Denies depression, anxiety Endocrine: Denies cold intolerance, heat intolerance, polydipsia Heme: Denies enlarged lymph nodes Allergy: No hayfever  Objective:   BP 130/80   Pulse 79   Temp 98.9 F (37.2 C) (Oral)   Ht 5' 3.5" (1.613 m)   Wt 131 lb 8 oz (59.6 kg)   BMI 22.93 kg/m  Ideal Body Weight: Weight in (lb) to have BMI = 25: 143.1  No exam data present  GEN: well developed, well nourished, no acute distress Eyes: conjunctiva and lids normal, PERRLA, EOMI ENT: TM clear, nares clear, oral exam WNL Neck: supple, no lymphadenopathy, no thyromegaly, no JVD Pulm: clear to auscultation and percussion, respiratory effort normal CV: regular rate and rhythm, S1-S2, no murmur, rub or gallop, no bruits, peripheral pulses normal and symmetric, no cyanosis, clubbing, edema or varicosities GI: soft, non-tender; no hepatosplenomegaly, masses; active bowel sounds all quadrants GU: no hernia, testicular mass, penile discharge Lymph: no cervical, axillary or inguinal adenopathy MSK: gait normal, muscle tone and strength WNL, no joint swelling, effusions, discoloration, crepitus  SKIN: clear, good turgor, color WNL, no rashes, lesions, or ulcerations Neuro: normal mental status, normal strength, sensation, and motion Psych: alert; oriented to person, place and time, normally interactive and not anxious or depressed in appearance. All labs reviewed with patient.  Lipids:    Component Value Date/Time   CHOL 195 09/14/2017 0857   TRIG 65.0 09/14/2017 0857   HDL 60.40 09/14/2017 0857   VLDL 13.0 09/14/2017 0857   CHOLHDL 3 09/14/2017 0857   CBC: CBC Latest Ref Rng & Units 09/14/2017 08/21/2015 10/23/2013  WBC 4.0 - 10.5 K/uL 6.4 7.0 7.0  Hemoglobin 13.0 - 17.0 g/dL 16.5 15.7 15.6  Hematocrit 39.0 - 52.0 % 47.0 46.2 45.8  Platelets  150.0 - 400.0 K/uL 195.0 196.0 381.8    Basic Metabolic Panel:    Component Value Date/Time   NA 141 09/14/2017 0857   K 4.0 09/14/2017 0857   CL 102 09/14/2017 0857   CO2 31 09/14/2017 0857   BUN 13 09/14/2017 0857   CREATININE 0.79 09/14/2017 0857   GLUCOSE 90 09/14/2017 0857   CALCIUM 9.3 09/14/2017 0857   Hepatic Function Latest Ref Rng & Units 09/14/2017 08/21/2015 10/23/2013  Total Protein 6.0 - 8.3 g/dL 6.9 6.2 6.4  Albumin 3.5 - 5.2 g/dL 4.4 4.2 3.9  AST 0 - 37 U/L 20 19 22   ALT 0 - 53 U/L 18 16 20   Alk Phosphatase 39 - 117 U/L 66 58 57  Total Bilirubin 0.2 - 1.2 mg/dL 0.9 1.0 1.2  Bilirubin, Direct 0.0 - 0.3 mg/dL 0.2 0.2 0.2    No results found for: TSH Lab Results  Component Value Date   PSA 1.06 09/14/2017   PSA 0.81 08/21/2015  PSA 0.80 10/23/2013    Assessment and Plan:   Healthcare maintenance  Doing great.  Health Maintenance Exam: The patient's preventative maintenance and recommended screening tests for an annual wellness exam were reviewed in full today. Brought up to date unless services declined.  Counselled on the importance of diet, exercise, and its role in overall health and mortality. The patient's FH and SH was reviewed, including their home life, tobacco status, and drug and alcohol status.  Follow-up in 1 year for physical exam or additional follow-up below.  Follow-up: No follow-ups on file. Or follow-up in 1 year if not noted.  Signed,  Maud Deed. Aiva Miskell, MD   Allergies as of 09/20/2017   No Known Allergies     Medication List        Accurate as of 09/20/17  3:27 PM. Always use your most recent med list.          MULTIVITAMIN PO Take 1 tablet by mouth daily.

## 2017-09-19 NOTE — Progress Notes (Signed)
I reviewed health advisor's note, was available for consultation, and agree with documentation and plan.  

## 2017-09-20 ENCOUNTER — Encounter: Payer: Self-pay | Admitting: Family Medicine

## 2017-09-20 ENCOUNTER — Ambulatory Visit (INDEPENDENT_AMBULATORY_CARE_PROVIDER_SITE_OTHER): Payer: Medicare HMO | Admitting: Family Medicine

## 2017-09-20 VITALS — BP 130/80 | HR 79 | Temp 98.9°F | Ht 63.5 in | Wt 131.5 lb

## 2017-09-20 DIAGNOSIS — Z0000 Encounter for general adult medical examination without abnormal findings: Secondary | ICD-10-CM | POA: Diagnosis not present

## 2017-10-13 DIAGNOSIS — R69 Illness, unspecified: Secondary | ICD-10-CM | POA: Diagnosis not present

## 2017-12-20 ENCOUNTER — Telehealth: Payer: Self-pay | Admitting: Family Medicine

## 2017-12-20 NOTE — Telephone Encounter (Signed)
Vaccine information entered in chart.

## 2017-12-20 NOTE — Telephone Encounter (Signed)
Patient dropped off updated tetanus. Placed on cart.

## 2017-12-22 ENCOUNTER — Ambulatory Visit: Payer: Self-pay | Admitting: *Deleted

## 2017-12-22 NOTE — Telephone Encounter (Signed)
Questions answered regarding vaccines required by [facility] to work for [facility]. Reason for Disposition . [1] Other NON-URGENT information for PCP AND [2] does not require PCP response  Answer Assessment - Initial Assessment Questions 1. REASON FOR CALL or QUESTION: "What is your reason for calling today?" or "How can I best help you?" or "What question do you have that I can help answer?"     Inquiring as to which vaccine immunizations are required to be employed by [facility]. 2. CALLER: Document the source of call. (e.g., laboratory, patient).     Patient.  Protocols used: PCP CALL - NO TRIAGE-A-AH

## 2018-01-18 DIAGNOSIS — H25813 Combined forms of age-related cataract, bilateral: Secondary | ICD-10-CM | POA: Diagnosis not present

## 2018-02-05 DIAGNOSIS — Z0100 Encounter for examination of eyes and vision without abnormal findings: Secondary | ICD-10-CM | POA: Diagnosis not present

## 2018-02-09 ENCOUNTER — Telehealth: Payer: Self-pay

## 2018-02-09 ENCOUNTER — Ambulatory Visit: Payer: Medicare HMO | Admitting: Family Medicine

## 2018-02-09 NOTE — Telephone Encounter (Signed)
Spoke with pt .  [facility]  They looked at his eye .  Stated if is wasn't bother patient.  It looks ok. don't worry about it   recommended pt check bp  Pt has not checked bp he is going to wait till he gets home.  Pt wanted to cancel appointment.  Pt never made appointment he stated the appointment was made with spouse.    Best number [phone]

## 2018-02-09 NOTE — Telephone Encounter (Signed)
Unable to reach pt by phone. Spoke with Mrs Bing [facility]Hackney [facility](DPR signed) pt has redness in rt eye for couple of days. No other symptoms. Mrs Bing NeighborsHackney scheduled 3930' appt 02/09/18 at 4PM with Harlin Heys Gessner FNP. Copy of [employer] note sent for scanning and one placed in D Gessner FNP in box.

## 2018-02-09 NOTE — Telephone Encounter (Signed)
Noted  

## 2018-03-14 DIAGNOSIS — R69 Illness, unspecified: Secondary | ICD-10-CM | POA: Diagnosis not present

## 2018-06-15 ENCOUNTER — Telehealth: Payer: Self-pay

## 2018-06-15 NOTE — Telephone Encounter (Signed)
[facility] Night - Client TELEPHONE ADVICE RECORD [facility] Medical Call Center Patient Name: Danny Cameron Gender: Male DOB: [birth year] Age: 68 Y 2 M 23 D Return Phone Number: [phone] (Primary) Address: City/State/Zip: [place] [address] Client [facility] Night - Client Client Site  [facility] [place] - Night Physician Copland, Karleen Hampshire - MD Contact Type Call Who Is Calling Patient / Member / Family / Caregiver Call Type Triage / Clinical Relationship To Patient Self Return Phone Number [phone] (Primary) Chief Complaint Headache Reason for Call Symptomatic / Request for Health Information Initial Comment Caller has a runny nose and slight headache. Translation No Nurse Assessment Nurse: Fredric Mare, RN, Misty Stanley Date/Time Lamount Cohen Time): 06/15/2018 8:07:15 AM Confirm and document reason for call. If symptomatic, describe symptoms. ---Caller has a runny nose and slight headache. No other symptoms Has the patient traveled to [place], [place], [place], [place], or [place] OR had close contact with a person known to have the novel coronavirus illness in the last 14 days? ---No Does the patient have any new or worsening symptoms? ---Yes Will a triage be completed? ---Yes Related visit to physician within the last 2 weeks? ---No Does the PT have any chronic conditions? (i.e. diabetes, asthma, this includes High risk factors for pregnancy, etc.) ---No Is this a behavioral health or substance abuse call? ---No Guidelines Guideline Title Affirmed Question Affirmed Notes Nurse Date/Time (Eastern Time) Coronavirus (COVID-19) Exposure [1] No COVID-19 EXPOSURE BUT [2] questions about Virgel Manifold 06/15/2018 8:09:45 AM Disp. Time Lamount Cohen Time) Disposition Final User 06/15/2018 8:14:15 AM Home Care Yes Fredric Mare, RN, Gracy Racer Disagree/Comply Comply Caller Understands Yes PreDisposition Call another nurse PLEASE  NOTE: All timestamps contained within this report are represented as [place] Standard Time. CONFIDENTIALTY NOTICE: This fax transmission is intended only for the addressee. It contains information that is legally privileged, confidential or otherwise protected from use or disclosure. If you are not the intended recipient, you are strictly prohibited from reviewing, disclosing, copying using or disseminating any of this information or taking any action in reliance on or regarding this information. If you have received this fax in error, please notify us immediately by telephone so that we can arrange for its return to [place]. Phone: [phone], Toll-Free: [phone], Fax: [phone] Page: 2 of 2 Call Id: [id number] Care Advice Given Per Guideline HOME CARE: * You should be able to treat this at home. COVID-19 - SYMPTOMS: * The coronavirus can cause a respiratory illness, such as bronchitis or pneumonia. * The most common symptoms are: cough, fever, and shortness of breath. * Other less common symptoms are: body aches, chills, diarrhea, fatigue, headache, runny nose, and sore throat COVID-19 - HOW IT IS SPREAD: COVID-19 - HOW TO PROTECT YOURSELF FROM GETTING SICK: * Avoid close contact with people known to have this new coronavirus infection. * Wash hands often with soap and water. * Alcohol-based hand cleaners are also effective. * Avoid touching the eyes, nose or mouth. Germs on the hands can spread this way. * Do not share eating utensils (e.g., spoon, fork). FAQ - CAN I GET CORONAVIRUS FROM TOUCHING AN INFECTED SURFACE? * It is possible that a person could get coronavirus by touching an object like a doorknob or a phone, or surfaces like a table or desk. * You can use a household cleaning spray or wipe (e.g., Clorox or similar) to clean the object or surface. Follow the label instructions. * Remember, wash your hands often with  soap and water. CALL BACK IF: * You have more questions. CARE ADVICE  given per Coronavirus (COVID-19) Exposure (Adult) guideline.

## 2018-06-15 NOTE — Telephone Encounter (Signed)
Unable to reach pt by phone.

## 2018-06-15 NOTE — Telephone Encounter (Signed)
Pt called back and pt does not have cough,SOB or fever. Pt will cb if condition changes or worsens; FYI to Dr Patsy Lager.

## 2018-08-29 ENCOUNTER — Other Ambulatory Visit: Payer: Self-pay

## 2018-08-29 ENCOUNTER — Encounter: Payer: Self-pay | Admitting: Family Medicine

## 2018-08-29 ENCOUNTER — Ambulatory Visit (INDEPENDENT_AMBULATORY_CARE_PROVIDER_SITE_OTHER): Payer: Medicare HMO | Admitting: Family Medicine

## 2018-08-29 VITALS — BP 140/90 | HR 78 | Temp 98.4°F | Ht 63.5 in | Wt 132.8 lb

## 2018-08-29 DIAGNOSIS — L820 Inflamed seborrheic keratosis: Secondary | ICD-10-CM

## 2018-08-29 DIAGNOSIS — R5383 Other fatigue: Secondary | ICD-10-CM | POA: Diagnosis not present

## 2018-08-29 DIAGNOSIS — Z125 Encounter for screening for malignant neoplasm of prostate: Secondary | ICD-10-CM | POA: Diagnosis not present

## 2018-08-29 DIAGNOSIS — E7800 Pure hypercholesterolemia, unspecified: Secondary | ICD-10-CM | POA: Diagnosis not present

## 2018-08-29 NOTE — Progress Notes (Addendum)
     Danny Sauseda T. [name], MD Primary Care and Sports Medicine [facility] at [facility] [address] [place], [address] Phone: [phone]  FAX: [phone]  Danny Cameron - 68 y.o. male  MRN [id number]  Date of Birth: 1950/06/10  Visit Date: 08/29/2018  PCP: Hannah Beat, MD  Referred by: Hannah Beat, MD  Chief Complaint  Patient presents with  . Wart on top of head   Subjective:   Danny Cameron is a 68 y.o. very pleasant male patient who presents with the following:  Procedure visit:  He has what he is perceived as a wart on the top of his head for some time.  It appears to be seborrheic keratosis that is mildly inflamed and it is bothering him somewhat..  Past Medical History, Surgical History, Social History, Family History, Problem List, Medications, and Allergies have been reviewed and updated if relevant.  Assessment and Plan:   Seborrheic keratoses, inflamed - Plan: CANCELED: Cryotherapy/destruct benign or premalignant lesion  Check blood work  Addendum: 08/31/2018:  The patient called me today and he has no interest in doing any bloodwork at all until his complete physical exam.  Procedure only:  Cryotherapy  Reason: inflammed seb k Location: head  Liquid nitrogen was applied using the liquid nitrogen gun without difficulty with an otoscope tip for concentration. Tolerated well without complications.   Follow-up: prn  No orders of the defined types were placed in this encounter.   Signed,  Elpidio Galea. Taite Baldassari, MD   Outpatient Encounter Medications as of 08/29/2018  Medication Sig  . Multiple Vitamin (MULTIVITAMIN PO) Take 1 tablet by mouth daily.     No facility-administered encounter medications on file as of 08/29/2018.

## 2018-08-30 ENCOUNTER — Telehealth: Payer: Self-pay

## 2018-08-30 NOTE — Telephone Encounter (Signed)
Please call  I cannot code it as preventative.  If he wants me to code it as a physical, then that can only be associated with a complete physical exam.  To do otherwise is fraud and would ultimately be denied by his insurance.

## 2018-08-30 NOTE — Telephone Encounter (Signed)
Patient has a lab only appointment on Thursday, not an appointment for a physical.    LM on answering machine that we are returning his call and to please call us back if he has any questions or concerns.    I am not sure what he is referring to "wanting to be sure it is coded correctly"    I do need clarification as to what his concern is for coding.

## 2018-08-30 NOTE — Telephone Encounter (Signed)
[facility] Night - Client Nonclinical Telephone Record AccessNurse Client [facility] Night - Client Client Site [facility] [place] - Night Physician Hannah Beat - MD Contact Type Call Who Is Calling Patient / Member / Family / Caregiver Caller Name Georgiy Scalisi Caller Phone Number [phone] Patient Name Danny Cameron Patient DOB Jun 13, 1950 Call Type Message Only Information Provided Reason for Call Request for General Office Information Initial Comment Caller states he is coming in for an annual physical on Thursday. He is needing to make sure it is going to be coded correctly.

## 2018-08-30 NOTE — Telephone Encounter (Signed)
Pt called back. He said he spoke with Dr Patsy Lager at 6/1 acute visit about just having labs as AWV without OV. He wants to be sure these labs are coded as preventative and he does not get billed for these labs. He had some billing issues with cpe years ago and is being proactive.

## 2018-08-31 NOTE — Telephone Encounter (Signed)
Danny Cameron notified as instructed by telephone.  He states then he will not come in tomorrow for the lab work so I cancelled his lab appointment on 09/01/2018  He will just wait a couple of months for the Covid 19 to calm down and will then schedule his MWV with Virl Axe and CPE with Dr. Patsy Lager and do labs at that time.

## 2018-08-31 NOTE — Addendum Note (Signed)
Addended by: Hannah Beat on: 08/31/2018 04:47 PM   Modules accepted: Orders, Level of Service

## 2018-09-01 ENCOUNTER — Other Ambulatory Visit: Payer: Medicare HMO

## 2018-09-21 DIAGNOSIS — R69 Illness, unspecified: Secondary | ICD-10-CM | POA: Diagnosis not present

## 2018-11-29 DIAGNOSIS — R69 Illness, unspecified: Secondary | ICD-10-CM | POA: Diagnosis not present

## 2019-01-02 ENCOUNTER — Other Ambulatory Visit: Payer: Self-pay

## 2019-01-02 ENCOUNTER — Other Ambulatory Visit (INDEPENDENT_AMBULATORY_CARE_PROVIDER_SITE_OTHER): Payer: Medicare HMO

## 2019-01-02 DIAGNOSIS — Z0000 Encounter for general adult medical examination without abnormal findings: Secondary | ICD-10-CM | POA: Diagnosis not present

## 2019-01-02 LAB — BASIC METABOLIC PANEL
BUN: 14 mg/dL (ref 6–23)
CO2: 32 mEq/L (ref 19–32)
Calcium: 9.2 mg/dL (ref 8.4–10.5)
Chloride: 104 mEq/L (ref 96–112)
Creatinine, Ser: 0.76 mg/dL (ref 0.40–1.50)
GFR: 102.06 mL/min (ref >60.00)
Glucose, Bld: 89 mg/dL (ref 70–99)
Potassium: 4 mEq/L (ref 3.5–5.1)
Sodium: 142 mEq/L (ref 135–145)

## 2019-01-02 LAB — CBC WITH DIFFERENTIAL/PLATELET
Basophils Absolute: 0.1 10*3/uL (ref 0.0–0.1)
Basophils Relative: 0.9 % (ref 0.0–3.0)
Eosinophils Absolute: 0.3 10*3/uL (ref 0.0–0.7)
Eosinophils Relative: 4.4 % (ref 0.0–5.0)
HCT: 45.2 % (ref 39.0–52.0)
Hemoglobin: 15.4 g/dL (ref 13.0–17.0)
Lymphocytes Relative: 21.6 % (ref 12.0–46.0)
Lymphs Abs: 1.4 10*3/uL (ref 0.7–4.0)
MCHC: 34.2 g/dL (ref 30.0–36.0)
MCV: 97.2 fl (ref 78.0–100.0)
Monocytes Absolute: 0.6 10*3/uL (ref 0.1–1.0)
Monocytes Relative: 8.7 % (ref 3.0–12.0)
Neutro Abs: 4.3 10*3/uL (ref 1.4–7.7)
Neutrophils Relative %: 64.4 % (ref 43.0–77.0)
Platelets: 187 10*3/uL (ref 150.0–400.0)
RBC: 4.65 Mil/uL (ref 4.22–5.81)
RDW: 13 % (ref 11.5–15.5)
WBC: 6.7 10*3/uL (ref 4.0–10.5)

## 2019-01-02 LAB — LIPID PANEL
Cholesterol: 178 mg/dL (ref 0–200)
HDL: 56.2 mg/dL (ref >39.00)
LDL Cholesterol: 110 mg/dL — ABNORMAL HIGH (ref 0–99)
NonHDL: 121.66
Total CHOL/HDL Ratio: 3
Triglycerides: 60 mg/dL (ref 0.0–149.0)
VLDL: 12 mg/dL (ref 0.0–40.0)

## 2019-01-02 LAB — HEPATIC FUNCTION PANEL
ALT: 15 U/L (ref 0–53)
AST: 20 U/L (ref 0–37)
Albumin: 4.3 g/dL (ref 3.5–5.2)
Alkaline Phosphatase: 70 U/L (ref 39–117)
Bilirubin, Direct: 0.2 mg/dL (ref 0.0–0.3)
Total Bilirubin: 1 mg/dL (ref 0.2–1.2)
Total Protein: 6.6 g/dL (ref 6.0–8.3)

## 2019-01-02 LAB — PSA, MEDICARE: PSA: 1.02 ng/ml (ref 0.10–4.00)

## 2019-01-08 ENCOUNTER — Encounter: Payer: Self-pay | Admitting: Family Medicine

## 2019-01-08 NOTE — Progress Notes (Signed)
Danny Cameron T. Danny Hicks, MD Primary Care and Sports Medicine [facility] [address] [place], [id number] Phone: [phone]  FAX: [phone]  Danny Cameron - 68 y.o. male  MRN [id number]  Date of Birth: March 16, 1951  Visit Date: 01/09/2019  PCP: Hannah Beatopland, Tedford Berg, MD  Referred by: Hannah Beatopland, Jamaul Heist, MD  Chief Complaint  Patient presents with  . Medicare Wellness  . Fever Blister    Wants Rx  . Skin Lesion    Scalp   Patient Care Team: Hannah Beatopland, Quintavius Niebuhr, MD as PCP - General (Family Medicine) Subjective:   Danny Cameron is a [age] y.o. pleasant patient who presents for a medicare wellness examination:  Preventative Health Maintenance Visit:  Health Maintenance Summary Reviewed and updated, unless pt declines services.  Tobacco History Reviewed. Alcohol: No concerns, no excessive use Exercise Habits: 7 x a week, bike, swimming STD concerns: no risk or activity to increase risk Drug Use: None Encouraged self-testicular check  Pool called him a couple times.  Water temp dropped.  Some coming.  Went swimming.  Felt that his fingers went a little numb.  Micah FlesherWent got of the pool.  Temp in the 50's.   Health Maintenance  Topic Date Due  . COLONOSCOPY  05/06/2015  . Hepatitis C Screening  09/14/2048 (Originally March 16, 1951)  . TETANUS/TDAP  12/21/2027  . INFLUENZA VACCINE  Completed  . PNA vac Low Risk Adult  Completed    Immunization History  Administered Date(s) Administered  . Fluad Quad(high Dose 65+) 11/29/2018  . Influenza, High Dose Seasonal PF 01/04/2017  . Influenza,inj,Quad PF,6+ Mos 12/08/2017  . Pneumococcal Conjugate-13 04/20/2016  . Pneumococcal Polysaccharide-23 05/26/2017  . Tdap 12/20/2017  . Zoster 11/17/2011    Patient Active Problem List   Diagnosis Date Noted  . Post-polio syndrome, no weakness 03/07/2013   Past Medical History:  Diagnosis Date  . Hepatitis A    years ago  . History of post-polio  syndrome 1975   No residual weakness   Past Surgical History:  Procedure Laterality Date  . COLONOSCOPY  05/2011   Social History   Socioeconomic History  . Marital status: Single    Spouse name: Not on file  . Number of children: 0  . Years of education: Not on file  . Highest education level: Not on file  Occupational History  . Occupation: Radiation protection practitionerphotographer/service rep    Employer: [employer]  Social Needs  . Financial resource strain: Not on file  . Food insecurity    Worry: Not on file    Inability: Not on file  . Transportation needs    Medical: Not on file    Non-medical: Not on file  Tobacco Use  . Smoking status: Former Games developermoker  . Smokeless tobacco: Never Used  Substance and Sexual Activity  . Alcohol use: Yes    Alcohol/week: 7.0 standard drinks    Types: 7 Cans of beer per week    Comment:  one beer a night  . Drug use: No  . Sexual activity: Not on file  Lifestyle  . Physical activity    Days per week: Not on file    Minutes per session: Not on file  . Stress: Not on file  Relationships  . Social Musicianconnections    Talks on phone: Not on file    Gets together: Not on file    Attends religious service: Not on file    Active member of club or organization: Not on file  Attends meetings of clubs or organizations: Not on file    Relationship status: Not on file  . Intimate partner violence    Fear of current or ex partner: Not on file    Emotionally abused: Not on file    Physically abused: Not on file    Forced sexual activity: Not on file  Other Topics Concern  . Not on file  Social History Narrative   Active cyclist, swimmer   Exercises 12 hours a week. Swims 1 mile at a time, 1 hour twice a week   Family History  Problem Relation Age of Onset  . Heart failure Father    No Known Allergies  Medication list has been reviewed and updated.   General: Denies fever, chills, sweats. No significant weight loss. Eyes: Denies blurring,significant itching  ENT: Denies earache, sore throat, and hoarseness. Cardiovascular: Denies chest pains, palpitations, dyspnea on exertion Respiratory: Denies cough, dyspnea at rest,wheeezing Breast: no concerns about lumps GI: Denies nausea, vomiting, diarrhea, constipation, change in bowel habits, abdominal pain, melena, hematochezia GU: Denies penile discharge, ED, urinary flow / outflow problems. No STD concerns. Musculoskeletal: Denies back pain, joint pain Derm: Denies rash, itching Neuro: Denies  paresthesias, frequent falls, frequent headaches Psych: Denies depression, anxiety Endocrine: Denies cold intolerance, heat intolerance, polydipsia Heme: Denies enlarged lymph nodes Allergy: No hayfever  Objective:   BP 130/80   Pulse 91   Temp 98.9 F (37.2 C) (Temporal)   Ht 5\' 3"  (1.6 m) Comment: Patient reported  Wt 123 lb (55.8 kg) Comment: Patient reported  SpO2 95%   BMI 21.79 kg/m  Fall Risk  01/09/2019 09/14/2017 04/20/2016  Falls in the past year? 0 No No   Ideal Body Weight: Weight in (lb) to have BMI = 25: 140.8  Hearing Screening   Method: Audiometry   125Hz  250Hz  500Hz  1000Hz  2000Hz  3000Hz  4000Hz  6000Hz  8000Hz   Right ear:   20 20 20  20     Left ear:   20 20 20  20       Visual Acuity Screening   Right eye Left eye Both eyes  Without correction:     With correction: 20/15 20/20 20/15    Depression screen [facility] 2/9 01/09/2019 09/14/2017 04/20/2016  Decreased Interest 0 0 0  Down, Depressed, Hopeless 0 0 0  PHQ - 2 Score 0 0 0  Altered sleeping - 0 -  Tired, decreased energy - 0 -  Change in appetite - 0 -  Feeling bad or failure about yourself  - 0 -  Trouble concentrating - 0 -  Moving slowly or fidgety/restless - 0 -  Suicidal thoughts - 0 -  PHQ-9 Score - 0 -  Difficult doing work/chores - Not difficult at all -     GEN: well developed, well nourished, no acute distress Eyes: conjunctiva and lids normal, PERRLA, EOMI ENT: TM clear, nares clear, oral exam WNL Neck:  supple, no lymphadenopathy, no thyromegaly, no JVD Pulm: clear to auscultation and percussion, respiratory effort normal CV: regular rate and rhythm, S1-S2, no murmur, rub or gallop, no bruits, peripheral pulses normal and symmetric, no cyanosis, clubbing, edema or varicosities GI: soft, non-tender; no hepatosplenomegaly, masses; active bowel sounds all quadrants GU: no hernia, testicular mass, penile discharge Lymph: no cervical, axillary or inguinal adenopathy MSK: gait normal, muscle tone and strength WNL, no joint swelling, effusions, discoloration, crepitus  SKIN: clear, good turgor, color WNL, no rashes, lesions, or ulcerations Neuro: normal mental status, normal strength, sensation, and motion Psych:  alert; oriented to person, place and time, normally interactive and not anxious or depressed in appearance.  All labs reviewed with patient. Results for orders placed or performed in visit on 01/02/19  Lipid panel  Result Value Ref Range   Cholesterol 178 0 - 200 mg/dL   Triglycerides 16.1 0.0 - 149.0 mg/dL   HDL 09.60 >45.40 mg/dL   VLDL 98.1 0.0 - 19.1 mg/dL   LDL Cholesterol 478 (H) 0 - 99 mg/dL   Total CHOL/HDL Ratio 3    NonHDL 121.66   Hepatic function panel  Result Value Ref Range   Total Bilirubin 1.0 0.2 - 1.2 mg/dL   Bilirubin, Direct 0.2 0.0 - 0.3 mg/dL   Alkaline Phosphatase 70 39 - 117 U/L   AST 20 0 - 37 U/L   ALT 15 0 - 53 U/L   Total Protein 6.6 6.0 - 8.3 g/dL   Albumin 4.3 3.5 - 5.2 g/dL  Basic metabolic panel  Result Value Ref Range   Sodium 142 135 - 145 mEq/L   Potassium 4.0 3.5 - 5.1 mEq/L   Chloride 104 96 - 112 mEq/L   CO2 32 19 - 32 mEq/L   Glucose, Bld 89 70 - 99 mg/dL   BUN 14 6 - 23 mg/dL   Creatinine, Ser 2.95 0.40 - 1.50 mg/dL   Calcium 9.2 8.4 - 62.1 mg/dL   GFR 308.65 >78.46 mL/min  CBC with Differential/Platelet  Result Value Ref Range   WBC 6.7 4.0 - 10.5 K/uL   RBC 4.65 4.22 - 5.81 Mil/uL   Hemoglobin 15.4 13.0 - 17.0 g/dL   HCT 96.2  95.2 - 84.1 %   MCV 97.2 78.0 - 100.0 fl   MCHC 34.2 30.0 - 36.0 g/dL   RDW 32.4 40.1 - 02.7 %   Platelets 187.0 150.0 - 400.0 K/uL   Neutrophils Relative % 64.4 43.0 - 77.0 %   Lymphocytes Relative 21.6 12.0 - 46.0 %   Monocytes Relative 8.7 3.0 - 12.0 %   Eosinophils Relative 4.4 0.0 - 5.0 %   Basophils Relative 0.9 0.0 - 3.0 %   Neutro Abs 4.3 1.4 - 7.7 K/uL   Lymphs Abs 1.4 0.7 - 4.0 K/uL   Monocytes Absolute 0.6 0.1 - 1.0 K/uL   Eosinophils Absolute 0.3 0.0 - 0.7 K/uL   Basophils Absolute 0.1 0.0 - 0.1 K/uL  PSA, Medicare  Result Value Ref Range   PSA 1.02 0.10 - 4.00 ng/ml    Assessment and Plan:     ICD-10-CM   1. Healthcare maintenance  Z00.00     Health Maintenance Exam: The patient's preventative maintenance and recommended screening tests for an annual wellness exam were reviewed in full today. Brought up to date unless services declined.  Counselled on the importance of diet, exercise, and its role in overall health and mortality. The patient's FH and SH was reviewed, including their home life, tobacco status, and drug and alcohol status.  Follow-up in 1 year for physical exam or additional follow-up below.  I have personally reviewed the Medicare Annual Wellness questionnaire and have noted 1. The patient's medical and social history 2. Their use of alcohol, tobacco or illicit drugs 3. Their current medications and supplements 4. The patient's functional ability including ADL's, fall risks, home safety risks and hearing or visual             impairment. 5. Diet and physical activities 6. Evidence for depression or mood disorders 7. Reviewed Updated provider list,  see scanned forms and [facility] Snapshot.  8. Reviewed whether or not the patient has HCPOA or living will, and discussed what this means with the patient.  Recommended he bring in a copy for his chart in [facility].  The patients weight, height, BMI and visual acuity have been recorded in the chart I have made  referrals, counseling and provided education to the patient based review of the above and I have provided the pt with a written personalized care plan for preventive services.  I have provided the patient with a copy of your personalized plan for preventive services. Instructed to take the time to review along with their updated medication list.  Follow-up: No follow-ups on file. Or follow-up in 1 year if not noted.  No future appointments.  No orders of the defined types were placed in this encounter.  There are no discontinued medications. No orders of the defined types were placed in this encounter.   Signed,  Maud Deed. Jaquawn Saffran, MD   Allergies as of 01/09/2019   No Known Allergies     Medication List       Accurate as of January 09, 2019  9:56 AM. If you have any questions, ask your nurse or doctor.        CALCIUM 600 PO Take 1 tablet by mouth daily.   MULTIVITAMIN PO Take 1 tablet by mouth daily.

## 2019-01-09 ENCOUNTER — Other Ambulatory Visit: Payer: Self-pay

## 2019-01-09 ENCOUNTER — Ambulatory Visit (INDEPENDENT_AMBULATORY_CARE_PROVIDER_SITE_OTHER): Payer: Medicare HMO | Admitting: Family Medicine

## 2019-01-09 ENCOUNTER — Encounter: Payer: Self-pay | Admitting: Family Medicine

## 2019-01-09 VITALS — BP 130/80 | HR 91 | Temp 98.9°F | Ht 63.0 in | Wt 123.0 lb

## 2019-01-09 DIAGNOSIS — Z0000 Encounter for general adult medical examination without abnormal findings: Secondary | ICD-10-CM | POA: Diagnosis not present

## 2019-01-09 MED ORDER — VALACYCLOVIR HCL 1 G PO TABS: ORAL_TABLET | ORAL | 2 refills | Status: DC

## 2019-01-11 ENCOUNTER — Telehealth: Payer: Self-pay

## 2019-01-11 NOTE — Telephone Encounter (Signed)
Pt said pt had [place] on 01/09/19. Where pts wife works some of her coworkers(5 different people) are positive for covid; pts wife wears a mask at work and tries to social distance.pt wife does not have symptoms but she is going to be tested thru where she works today. pts wife does not have any symptoms. Pt said he had slight h/a on 01/10/19 but pt did his normal activity and h/a went away on its own. Pt has no covid symptoms, no travel and no known exposure to + covid. Pt wears mask if goes to grocery store. When pt swims has his own lane and social distances.  since pt's wife has had so many people where she works test + for covid pt is going to [facility] main visitor entrance for testing later this morning. And pt said he and his wife will self quarantine.pt wanted Dr Lorelei Pont to be aware of what was going on with pt. Any further suggestions pt request cb.

## 2019-01-11 NOTE — Telephone Encounter (Signed)
Absolutely good plan.

## 2019-01-11 NOTE — Telephone Encounter (Signed)
Danny Cameron notified as instructed by telephone.

## 2019-01-11 NOTE — Telephone Encounter (Signed)
Patient called back and stated that his wife just called him. And her test was negative. So patient does not feel like there is a need now for him to go be tested.

## 2019-01-11 NOTE — Telephone Encounter (Signed)
Please call  With her having a negative test, I think that is a reasonable way to handle it.

## 2019-03-13 DIAGNOSIS — H25813 Combined forms of age-related cataract, bilateral: Secondary | ICD-10-CM | POA: Diagnosis not present

## 2019-03-28 DIAGNOSIS — R69 Illness, unspecified: Secondary | ICD-10-CM | POA: Diagnosis not present

## 2019-04-12 ENCOUNTER — Other Ambulatory Visit: Payer: Self-pay

## 2019-04-12 ENCOUNTER — Ambulatory Visit (INDEPENDENT_AMBULATORY_CARE_PROVIDER_SITE_OTHER): Payer: Medicare HMO | Admitting: Family Medicine

## 2019-04-12 ENCOUNTER — Encounter: Payer: Self-pay | Admitting: Family Medicine

## 2019-04-12 VITALS — BP 120/84 | HR 93 | Temp 97.8°F | Ht 63.0 in | Wt 125.0 lb

## 2019-04-12 DIAGNOSIS — L989 Disorder of the skin and subcutaneous tissue, unspecified: Secondary | ICD-10-CM

## 2019-04-12 NOTE — Progress Notes (Signed)
     Dejaun Vidrio T. Mineola Duan, MD Primary Care and Sports Medicine [facility] [address] [facility] [address] [place], [address] Phone: [phone]  FAX: [phone]  Woodroe Vogan - 69 y.o. male  MRN [id number]  Date of Birth: 12-Apr-1950  Visit Date: 04/12/2019  PCP: Hannah Beat, MD  Referred by: Hannah Beat, MD  Chief Complaint  Patient presents with  . Skin Lesion    Under Lip    This visit occurred during the SARS-CoV-2 public health emergency.  Safety protocols were in place, including screening questions prior to the visit, additional usage of staff PPE, and extensive cleaning of exam room while observing appropriate contact time as indicated for disinfecting solutions.   Subjective:   Danny Cameron is a 69 y.o. very pleasant male patient who presents with the following:  He is here for an evaluation and treatment of a skin lesion under his lip.  Good year last year. Vaccine on [place]   Skin lesion, cryo x 2  Procedure only.  Cryotherapy  Reason: skin lesion, irritated Location: underneath lip, approx 1 1/2 cm beneath lip  Liquid nitrogen was applied using the liquid nitrogen gun without difficulty with an otoscope tip for concentration. Tolerated well without complications.    ICD-10-CM   1. Skin lesion of face  L98.9 PR DESTRUCTION BENIGN LESIONS UP TO 14    Signed,  Dal Blew T. Garnett Nunziata, MD

## 2019-04-18 DIAGNOSIS — R69 Illness, unspecified: Secondary | ICD-10-CM | POA: Diagnosis not present

## 2019-04-19 ENCOUNTER — Telehealth: Payer: Self-pay | Admitting: *Deleted

## 2019-04-19 NOTE — Telephone Encounter (Signed)
Patient returned Donna's call.  Patient can be reached at [phone].

## 2019-04-19 NOTE — Telephone Encounter (Signed)
  His colonoscopy is due 04/2020.  It is not unreasonable to do an IFOB.  I can happily do this.  It is also reasonable to wait until his colonoscopy - this is ok with low risk colonoscopy.  The GI doctors drive all of these recommendation.  Health Maintenance  Topic Date Due   Hepatitis C Screening  09/14/2048 (Originally 06-07-1950)   COLONOSCOPY  05/05/2020   TETANUS/TDAP  12/21/2027   INFLUENZA VACCINE  Completed   PNA vac Low Risk Adult  Completed

## 2019-04-19 NOTE — Telephone Encounter (Signed)
Left message for Danny Cameron to return my call.

## 2019-04-19 NOTE — Telephone Encounter (Signed)
Patient called stating that he has not done a stool test in years. Patient stated that he had a colonoscopy about 10 years ago and is due to have one next year. Patient stated that he does not have any symptoms and there is no history of colon cancer in his family that he is aware of. Patient stated that his sister is a Engineer, civil (consulting) and was questioning him about it.  Patient wants to know if he should have a stool test now?

## 2019-04-19 NOTE — Telephone Encounter (Signed)
Mr. Seeley notified as instructed by telephone.  He is good with waiting on colonoscopy in 2022 but if he changes his mind he will wait and discuss it at his annual physical.

## 2019-06-05 DIAGNOSIS — R69 Illness, unspecified: Secondary | ICD-10-CM | POA: Diagnosis not present

## 2019-07-20 ENCOUNTER — Telehealth: Payer: Self-pay | Admitting: Family Medicine

## 2019-07-20 NOTE — Telephone Encounter (Signed)
Noted  

## 2019-07-20 NOTE — Telephone Encounter (Signed)
Patient received an automated reminder to schedule a Colonoscopy.  Patient wanted to let Dr.Copland know that he'll schedule the colonoscopy early next year.

## 2019-08-07 DIAGNOSIS — Z0100 Encounter for examination of eyes and vision without abnormal findings: Secondary | ICD-10-CM | POA: Diagnosis not present

## 2019-08-23 ENCOUNTER — Telehealth: Payer: Self-pay | Admitting: Family Medicine

## 2019-08-23 DIAGNOSIS — Z1211 Encounter for screening for malignant neoplasm of colon: Secondary | ICD-10-CM

## 2019-08-23 NOTE — Telephone Encounter (Signed)
FYI  Patient wanted you aware. He is going to call back to schedule his AWV with you but stated he is wanting to talk them about getting scheduled for a colonoscopy \ He stated that his wife just had it done with Dr Norma Fredrickson @ [facility] And would like to be seen by him for the colonoscopy

## 2019-08-23 NOTE — Telephone Encounter (Signed)
I am happy to refer him to Dr. Norma Fredrickson.  On our computer system, it is noted that he is a "non-preferred" physican on his health plan.  I would ask his insurance provider to confirm.  Up to him, but monetarily reasonable to check.

## 2019-08-23 NOTE — Telephone Encounter (Signed)
Mr. Betke notified as instructed by telephone.  He states he was just giving Dr. Cyndie Chime a heads up.  He doesn't need a referral currently.  He is not due for his coloscopy until next February.  In the meantime he will check with his insurance and will discuss this further with Dr. Patsy Lager at his September appointments.

## 2019-09-26 DIAGNOSIS — R69 Illness, unspecified: Secondary | ICD-10-CM | POA: Diagnosis not present

## 2019-11-06 ENCOUNTER — Telehealth: Payer: Self-pay | Admitting: *Deleted

## 2019-11-06 DIAGNOSIS — Z20822 Contact with and (suspected) exposure to covid-19: Secondary | ICD-10-CM | POA: Diagnosis not present

## 2019-11-06 DIAGNOSIS — Z03818 Encounter for observation for suspected exposure to other biological agents ruled out: Secondary | ICD-10-CM | POA: Diagnosis not present

## 2019-11-06 NOTE — Telephone Encounter (Signed)
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 Patient stated that he has some mucus and coughing it up. Patient stated the pharmacist recommenced that he take mucinex. Patient stated that he feels that he has a little chest congestion and wants to know if he should be tested for covid. Asked patient if he answered the questions at [facility] at work and if so what have they recommended. Patient stated that his manager stopped doing it several months ago so he did too.Patient stated that they are contracted with [facility] for the valet services.  Advised patient with his symptoms he should be tested for covid. Patient denies fever, chills, SOB or difficulty breathing. Patient stated that he was advised that they no longer test at [facility] for covid and was given a paper with information to go somewhere downtown to be tested. Patient stated that he went several places today to get tested but did not have any luck. Patient was advised that [facility] and [facility] in [place] does the testing. Patient stated that he will reach out to these facilities tomorrow and go get tested. Patient was given ER precautions and he verbalized understanding. Patient was advised that he should avoid being around people until he gets his test results back.

## 2019-11-06 NOTE — Telephone Encounter (Signed)
Patient left a voicemail requesting a call back. Patient stated that he is coughing a lot and has a lot of mucus. Patient wants to know if he should go and get a covid test. Patient stated that he thinks that he has allergies.

## 2019-11-06 NOTE — Telephone Encounter (Signed)
agree

## 2019-11-07 NOTE — Telephone Encounter (Signed)
[facility] Night - Client Nonclinical Telephone Record AccessNurse Client [facility] Night - Client Client Site [facility] - Night Physician Hannah Beat - MD Contact Type Call Who Is Calling Patient / Member / Family / Caregiver Caller Name Danny Cameron Caller Phone Number [phone] Patient Name Danny Cameron Patient DOB 1950/12/16 Call Type Message Only Information Provided Reason for Call Request for General Office Information Initial Comment Caller states that he spoke to Dr.Copland`s nurse yesterday and went to [facility] for covid testing. Caller said the results should be ready today and said if its positive he will follow up with the office. Disp. Time Disposition Final User 11/07/2019 7:49:22 AM General Information Provided Yes Gerrie Nordmann Call Closed By: Gerrie Nordmann Transaction Date/Time: 11/07/2019 7:46:33 AM (ET)

## 2019-11-08 NOTE — Telephone Encounter (Signed)
He needs to quarantine until his test comes back.  If it is negative, then he does not need to quarantine, and he can treat this with over the counter medicine like a common cold.

## 2019-11-08 NOTE — Telephone Encounter (Signed)
Pt said that he got back results from [facility] in [place] and covid test results were negative; pt did not have a rapid covid test but did the covid test that was sent off to be evaluated.. Pt said he guesses chest congestion is still most concerning symptom. Pt started mucinex on 11/05/19  and decided to stop after 3 days.pt said the mucus that was coming up is a lot better.pt said throat is dry but not sore and pt is drinking fair amt of liquids. No fever and no other covid symptoms. Pt said he is doing well; pt walked an hour on 11/07/19.pt said spitting up mucus is 90% better. On 11/06/19 pt swam for 1 hr. Pt saw card Dr Welton Flakes 8 yrs ago and pt was told that had one clogged artery of 20% and that could cause pt to have more chest mucus. Pt said he did not know if that applied to what was going on with him now but he wanted to make sure Dr Patsy Lager was aware of that. Offered pt a virtual appt with Dr Patsy Lager on 11/09/19 but pt declined appt and wanted note sent to Dr Patsy Lager to see if he has any suggestions. [facility].

## 2019-11-09 NOTE — Telephone Encounter (Signed)
Spoke to pt. His test is negative.

## 2019-11-30 ENCOUNTER — Other Ambulatory Visit: Payer: Self-pay | Admitting: Family Medicine

## 2019-11-30 DIAGNOSIS — Z0000 Encounter for general adult medical examination without abnormal findings: Secondary | ICD-10-CM

## 2019-12-05 ENCOUNTER — Other Ambulatory Visit: Payer: Self-pay

## 2019-12-05 ENCOUNTER — Other Ambulatory Visit (INDEPENDENT_AMBULATORY_CARE_PROVIDER_SITE_OTHER): Payer: Medicare HMO

## 2019-12-05 DIAGNOSIS — Z0000 Encounter for general adult medical examination without abnormal findings: Secondary | ICD-10-CM | POA: Diagnosis not present

## 2019-12-05 LAB — CBC WITH DIFFERENTIAL/PLATELET
Basophils Absolute: 0.1 10*3/uL (ref 0.0–0.1)
Basophils Relative: 1.2 % (ref 0.0–3.0)
Eosinophils Absolute: 0.4 10*3/uL (ref 0.0–0.7)
Eosinophils Relative: 6.7 % — ABNORMAL HIGH (ref 0.0–5.0)
HCT: 43.8 % (ref 39.0–52.0)
Hemoglobin: 15 g/dL (ref 13.0–17.0)
Lymphocytes Relative: 20.3 % (ref 12.0–46.0)
Lymphs Abs: 1.2 10*3/uL (ref 0.7–4.0)
MCHC: 34.4 g/dL (ref 30.0–36.0)
MCV: 95.5 fl (ref 78.0–100.0)
Monocytes Absolute: 0.5 10*3/uL (ref 0.1–1.0)
Monocytes Relative: 8.3 % (ref 3.0–12.0)
Neutro Abs: 3.7 10*3/uL (ref 1.4–7.7)
Neutrophils Relative %: 63.5 % (ref 43.0–77.0)
Platelets: 173 10*3/uL (ref 150.0–400.0)
RBC: 4.58 Mil/uL (ref 4.22–5.81)
RDW: 12.4 % (ref 11.5–15.5)
WBC: 5.8 10*3/uL (ref 4.0–10.5)

## 2019-12-05 LAB — BASIC METABOLIC PANEL
BUN: 14 mg/dL (ref 6–23)
CO2: 29 mEq/L (ref 19–32)
Calcium: 8.8 mg/dL (ref 8.4–10.5)
Chloride: 104 mEq/L (ref 96–112)
Creatinine, Ser: 0.73 mg/dL (ref 0.40–1.50)
GFR: 106.62 mL/min (ref >60.00)
Glucose, Bld: 88 mg/dL (ref 70–99)
Potassium: 4.1 mEq/L (ref 3.5–5.1)
Sodium: 141 mEq/L (ref 135–145)

## 2019-12-05 LAB — LIPID PANEL
Cholesterol: 162 mg/dL (ref 0–200)
HDL: 55.9 mg/dL (ref >39.00)
LDL Cholesterol: 97 mg/dL (ref 0–99)
NonHDL: 106.06
Total CHOL/HDL Ratio: 3
Triglycerides: 44 mg/dL (ref 0.0–149.0)
VLDL: 8.8 mg/dL (ref 0.0–40.0)

## 2019-12-05 LAB — HEPATIC FUNCTION PANEL
ALT: 16 U/L (ref 0–53)
AST: 22 U/L (ref 0–37)
Albumin: 4.1 g/dL (ref 3.5–5.2)
Alkaline Phosphatase: 57 U/L (ref 39–117)
Bilirubin, Direct: 0.2 mg/dL (ref 0.0–0.3)
Total Bilirubin: 1 mg/dL (ref 0.2–1.2)
Total Protein: 6.3 g/dL (ref 6.0–8.3)

## 2019-12-05 LAB — HEMOGLOBIN A1C: Hgb A1c MFr Bld: 5.2 % (ref 4.6–6.5)

## 2019-12-05 LAB — PSA, MEDICARE: PSA: 1.07 ng/ml (ref 0.10–4.00)

## 2019-12-11 ENCOUNTER — Ambulatory Visit (INDEPENDENT_AMBULATORY_CARE_PROVIDER_SITE_OTHER): Payer: Medicare HMO | Admitting: Family Medicine

## 2019-12-11 ENCOUNTER — Other Ambulatory Visit: Payer: Self-pay

## 2019-12-11 ENCOUNTER — Encounter: Payer: Self-pay | Admitting: Family Medicine

## 2019-12-11 VITALS — BP 140/80 | HR 76 | Temp 98.2°F | Ht 63.5 in | Wt 127.5 lb

## 2019-12-11 DIAGNOSIS — Z1211 Encounter for screening for malignant neoplasm of colon: Secondary | ICD-10-CM

## 2019-12-11 DIAGNOSIS — Z23 Encounter for immunization: Secondary | ICD-10-CM | POA: Diagnosis not present

## 2019-12-11 DIAGNOSIS — Z0000 Encounter for general adult medical examination without abnormal findings: Secondary | ICD-10-CM | POA: Diagnosis not present

## 2019-12-11 NOTE — Patient Instructions (Signed)
The newer [facility] shot is much better than the older shot. The [facility] shot requires 2 shots given 6 months apart.  Almost always, this shot is not covered in our office by your insurance. It costs 500 dollars, but essentially all insurances cover it at your pharmacy.  I would call your insurance number on your card to confirm this.

## 2019-12-11 NOTE — Progress Notes (Signed)
Danny Dykes T. Danny Fairbank, MD, CAQ Sports Medicine  Primary Care and Sports Medicine [facility] at [facility] [address] [place], [address]  Phone: [phone]  FAX: [phone]  Danny Cameron - 69 y.o. male  MRN [id number]  Date of Birth: 04/03/1950  Date: 12/11/2019  PCP: Hannah Beat, MD  Referral: Hannah Beat, MD  Chief Complaint  Patient presents with  . Annual Exam    Declines Medicare Wellness    This visit occurred during the SARS-CoV-2 public health emergency.  Safety protocols were in place, including screening questions prior to the visit, additional usage of staff PPE, and extensive cleaning of exam room while observing appropriate contact time as indicated for disinfecting solutions.   Patient Care Team: Hannah Beat, MD as PCP - General (Family Medicine) Subjective:   Danny Cameron is a 69 y.o. pleasant patient who presents for a medicare wellness examination:  Preventative Health Maintenance Visit:  Health Maintenance Summary Reviewed and updated, unless pt declines services.  Tobacco History Reviewed. Alcohol: No concerns, no excessive use.  Exercise Habits: swims, cycles, weights all the time STD concerns: no risk or activity to increase risk Drug Use: None  Shingrix vaccine  Selina Cooley of Medicare paperwork - pending  Health Maintenance  Topic Date Due  . Hepatitis C Screening  09/14/2048 (Originally [birth year])  . COLONOSCOPY  05/05/2020  . TETANUS/TDAP  12/21/2027  . INFLUENZA VACCINE  Completed  . COVID-19 Vaccine  Completed  . PNA vac Low Risk Adult  Completed    Immunization History  Administered Date(s) Administered  . Fluad Quad(high Dose 65+) 11/29/2018, 12/11/2019  . Influenza, High Dose Seasonal PF 01/04/2017  . Influenza,inj,Quad PF,6+ Mos 12/08/2017  . PFIZER SARS-COV-2 Vaccination 04/15/2019, 05/06/2019  . Pneumococcal Conjugate-13 04/20/2016  . Pneumococcal Polysaccharide-23  05/26/2017  . Tdap 12/20/2017  . Zoster 11/17/2011    Patient Active Problem List   Diagnosis Date Noted  . Post-polio syndrome, no weakness 03/07/2013    Past Medical History:  Diagnosis Date  . Hepatitis A    years ago  . History of post-polio syndrome 1975   No residual weakness    Past Surgical History:  Procedure Laterality Date  . COLONOSCOPY  05/2011    Family History  Problem Relation Age of Onset  . Heart failure Father     Past Medical History, Surgical History, Social History, Family History, Problem List, Medications, and Allergies have been reviewed and updated if relevant.  Review of Systems: Pertinent positives are listed above.  Otherwise, a full 14 point review of systems has been done in full and it is negative except where it is noted positive.  Objective:   BP 140/80   Pulse 76   Temp 98.2 F (36.8 C) (Temporal)   Ht 5' 3.5" (1.613 m)   Wt 127 lb 8 oz (57.8 kg)   SpO2 96%   BMI 22.23 kg/m  Fall Risk  12/11/2019 01/09/2019 09/14/2017 04/20/2016  Falls in the past year? 0 0 No No   Ideal Body Weight: Weight in (lb) to have BMI = 25: 143.1 No exam data present Depression screen [facility] 2/9 12/11/2019 01/09/2019 09/14/2017 04/20/2016  Decreased Interest 0 0 0 0  Down, Depressed, Hopeless 0 0 0 0  PHQ - 2 Score 0 0 0 0  Altered sleeping - - 0 -  Tired, decreased energy - - 0 -  Change in appetite - - 0 -  Feeling bad or failure about yourself  - -  0 -  Trouble concentrating - - 0 -  Moving slowly or fidgety/restless - - 0 -  Suicidal thoughts - - 0 -  PHQ-9 Score - - 0 -  Difficult doing work/chores - - Not difficult at all -     GEN: well developed, well nourished, no acute distress Eyes: conjunctiva and lids normal, PERRLA, EOMI ENT: TM clear, nares clear, oral exam WNL Neck: supple, no lymphadenopathy, no thyromegaly, no JVD Pulm: clear to auscultation and percussion, respiratory effort normal CV: regular rate and rhythm, S1-S2, no murmur,  rub or gallop, no bruits, peripheral pulses normal and symmetric, no cyanosis, clubbing, edema or varicosities GI: soft, non-tender; no hepatosplenomegaly, masses; active bowel sounds all quadrants GU: no hernia, testicular mass, penile discharge Lymph: no cervical, axillary or inguinal adenopathy MSK: gait normal, muscle tone and strength WNL, no joint swelling, effusions, discoloration, crepitus  SKIN: clear, good turgor, color WNL, no rashes, lesions, or ulcerations Neuro: normal mental status, normal strength, sensation, and motion Psych: alert; oriented to person, place and time, normally interactive and not anxious or depressed in appearance.  All labs reviewed with patient.  Results for orders placed or performed in visit on 12/05/19  Hemoglobin A1c  Result Value Ref Range   Hgb A1c MFr Bld 5.2 4.6 - 6.5 %  PSA, Medicare  Result Value Ref Range   PSA 1.07 0.10 - 4.00 ng/ml  CBC with Differential/Platelet  Result Value Ref Range   WBC 5.8 4.0 - 10.5 K/uL   RBC 4.58 4.22 - 5.81 Mil/uL   Hemoglobin 15.0 13.0 - 17.0 g/dL   HCT 38.1 39 - 52 %   MCV 95.5 78.0 - 100.0 fl   MCHC 34.4 30.0 - 36.0 g/dL   RDW 82.9 93.7 - 16.9 %   Platelets 173.0 150 - 400 K/uL   Neutrophils Relative % 63.5 43 - 77 %   Lymphocytes Relative 20.3 12 - 46 %   Monocytes Relative 8.3 3 - 12 %   Eosinophils Relative 6.7 (H) 0 - 5 %   Basophils Relative 1.2 0 - 3 %   Neutro Abs 3.7 1.4 - 7.7 K/uL   Lymphs Abs 1.2 0.7 - 4.0 K/uL   Monocytes Absolute 0.5 0 - 1 K/uL   Eosinophils Absolute 0.4 0 - 0 K/uL   Basophils Absolute 0.1 0 - 0 K/uL  Basic metabolic panel  Result Value Ref Range   Sodium 141 135 - 145 mEq/L   Potassium 4.1 3.5 - 5.1 mEq/L   Chloride 104 96 - 112 mEq/L   CO2 29 19 - 32 mEq/L   Glucose, Bld 88 70 - 99 mg/dL   BUN 14 6 - 23 mg/dL   Creatinine, Ser 6.78 0.40 - 1.50 mg/dL   GFR 938.10 >17.51 mL/Cameron   Calcium 8.8 8.4 - 10.5 mg/dL  Hepatic function panel  Result Value Ref Range    Total Bilirubin 1.0 0.2 - 1.2 mg/dL   Bilirubin, Direct 0.2 0.0 - 0.3 mg/dL   Alkaline Phosphatase 57 39 - 117 U/L   AST 22 0 - 37 U/L   ALT 16 0 - 53 U/L   Total Protein 6.3 6.0 - 8.3 g/dL   Albumin 4.1 3.5 - 5.2 g/dL  Lipid panel  Result Value Ref Range   Cholesterol 162 0 - 200 mg/dL   Triglycerides 02.5 0 - 149 mg/dL   HDL 85.27 >78.24 mg/dL   VLDL 8.8 0.0 - 23.5 mg/dL   LDL Cholesterol 97  0 - 99 mg/dL   Total CHOL/HDL Ratio 3    NonHDL 106.06     Assessment and Plan:     ICD-10-CM   1. Healthcare maintenance  Z00.00 Flu Vaccine QUAD High Dose(Fluad)  2. Need for influenza vaccination  Z23 Flu Vaccine QUAD High Dose(Fluad)   Doing great Update shingrix, ow utd and doing really well  He is bringing in his medicare paperwork  Health Maintenance Exam: The patient's preventative maintenance and recommended screening tests for an annual wellness exam were reviewed in full today. Brought up to date unless services declined.  Counselled on the importance of diet, exercise, and its role in overall health and mortality. The patient's FH and SH was reviewed, including their home life, tobacco status, and drug and alcohol status.  Follow-up in 1 year for physical exam or additional follow-up below.  I have personally reviewed the Medicare Annual Wellness questionnaire and have noted 1. The patient's medical and social history 2. Their use of alcohol, tobacco or illicit drugs 3. Their current medications and supplements 4. The patient's functional ability including ADL's, fall risks, home safety risks and hearing or visual             impairment. 5. Diet and physical activities 6. Evidence for depression or mood disorders 7. Reviewed Updated provider list, see scanned forms and [facility] Snapshot.  8. Reviewed whether or not the patient has HCPOA or living will, and discussed what this means with the patient.  Recommended he bring in a copy for his chart in [facility].  The patients  weight, height, BMI and visual acuity have been recorded in the chart I have made referrals, counseling and provided education to the patient based review of the above and I have provided the pt with a written personalized care plan for preventive services.  I have provided the patient with a copy of your personalized plan for preventive services. Instructed to take the time to review along with their updated medication list.  Follow-up: No follow-ups on file. Or follow-up in 1 year if not noted.  No future appointments.  No orders of the defined types were placed in this encounter.  There are no discontinued medications. Orders Placed This Encounter  Procedures  . Flu Vaccine QUAD High Dose(Fluad)    Signed,  Guy Seese T. Lorea Kupfer, MD   Allergies as of 12/11/2019   No Known Allergies     Medication List       Accurate as of December 11, 2019  9:24 AM. If you have any questions, ask your nurse or doctor.        CALCIUM 600 PO Take 1 tablet by mouth daily.   MULTIVITAMIN PO Take 1 tablet by mouth daily.

## 2020-04-23 ENCOUNTER — Telehealth: Payer: Self-pay | Admitting: *Deleted

## 2020-04-23 NOTE — Telephone Encounter (Signed)
Patient left a voicemail stating that he doesn't take any medications but recently had to get a colonoscopy prep kit. Patient stated that he has found out that [facility]  Is a lot cheaper than the pharmacy that he uses now. Patient stated that he would like to have his preferred pharmacy changed to [facility] changed per patient's request.

## 2020-05-08 ENCOUNTER — Other Ambulatory Visit: Payer: Medicare HMO

## 2020-05-09 ENCOUNTER — Other Ambulatory Visit: Payer: Self-pay

## 2020-05-09 ENCOUNTER — Other Ambulatory Visit
Admission: RE | Admit: 2020-05-09 | Discharge: 2020-05-09 | Disposition: A | Discharge status: Home / Self Care | Payer: Medicare HMO | Source: Ambulatory Visit | Attending: Gastroenterology | Admitting: Gastroenterology

## 2020-05-09 DIAGNOSIS — Z20822 Contact with and (suspected) exposure to covid-19: Secondary | ICD-10-CM | POA: Diagnosis not present

## 2020-05-09 DIAGNOSIS — Z01812 Encounter for preprocedural laboratory examination: Secondary | ICD-10-CM | POA: Insufficient documentation

## 2020-05-10 ENCOUNTER — Other Ambulatory Visit: Admission: RE | Admit: 2020-05-10 | Payer: Medicare HMO | Source: Ambulatory Visit

## 2020-05-10 LAB — SARS CORONAVIRUS 2 (TAT 6-24 HRS): SARS Coronavirus 2: NEGATIVE

## 2020-05-13 ENCOUNTER — Encounter: Payer: Self-pay | Admitting: Internal Medicine

## 2020-05-13 ENCOUNTER — Encounter
Admission: RE | Disposition: A | Discharge status: Home / Self Care | Payer: Self-pay | Source: Home / Self Care | Attending: Internal Medicine

## 2020-05-13 ENCOUNTER — Ambulatory Visit: Payer: Medicare HMO | Admitting: Certified Registered Nurse Anesthetist

## 2020-05-13 ENCOUNTER — Ambulatory Visit
Admission: RE | Admit: 2020-05-13 | Discharge: 2020-05-13 | Disposition: A | Discharge status: Home / Self Care | Payer: Medicare HMO | Attending: Internal Medicine | Admitting: Internal Medicine

## 2020-05-13 DIAGNOSIS — K5790 Diverticulosis of intestine, part unspecified, without perforation or abscess without bleeding: Secondary | ICD-10-CM | POA: Diagnosis not present

## 2020-05-13 DIAGNOSIS — K640 First degree hemorrhoids: Secondary | ICD-10-CM | POA: Diagnosis not present

## 2020-05-13 DIAGNOSIS — K5730 Diverticulosis of large intestine without perforation or abscess without bleeding: Secondary | ICD-10-CM | POA: Diagnosis not present

## 2020-05-13 DIAGNOSIS — Z8612 Personal history of poliomyelitis: Secondary | ICD-10-CM | POA: Diagnosis not present

## 2020-05-13 DIAGNOSIS — Z1211 Encounter for screening for malignant neoplasm of colon: Secondary | ICD-10-CM | POA: Diagnosis not present

## 2020-05-13 DIAGNOSIS — K648 Other hemorrhoids: Secondary | ICD-10-CM | POA: Diagnosis not present

## 2020-05-13 DIAGNOSIS — Z8619 Personal history of other infectious and parasitic diseases: Secondary | ICD-10-CM | POA: Insufficient documentation

## 2020-05-13 HISTORY — PX: COLONOSCOPY WITH PROPOFOL: SHX5780

## 2020-05-13 SURGERY — COLONOSCOPY WITH PROPOFOL
Anesthesia: General

## 2020-05-13 MED ORDER — SODIUM CHLORIDE 0.9 % IV SOLN: INTRAVENOUS | Status: DC

## 2020-05-13 MED ORDER — PROPOFOL 500 MG/50ML IV EMUL
INTRAVENOUS | Status: DC | PRN
  Administered 2020-05-13: 160 ug/kg/min via INTRAVENOUS

## 2020-05-13 MED ORDER — PROPOFOL 10 MG/ML IV BOLUS
INTRAVENOUS | Status: DC | PRN
  Administered 2020-05-13: 60 mg via INTRAVENOUS
  Administered 2020-05-13: 20 mg via INTRAVENOUS
  Administered 2020-05-13 (×2): 10 mg via INTRAVENOUS

## 2020-05-13 NOTE — Transfer of Care (Signed)
Immediate Anesthesia Transfer of Care Note  Patient: Danny Cameron  Procedure(s) Performed: COLONOSCOPY WITH PROPOFOL (N/A )  Patient Location: PACU  Anesthesia Type:General  Level of Consciousness: sedated  Airway & Oxygen Therapy: Patient Spontanous Breathing  Post-op Assessment: Report given to RN and Post -op Vital signs reviewed and stable  Post vital signs: Reviewed and stable  Last Vitals:  Vitals Value Taken Time  BP 101/68 05/13/20 1505  Temp 36.3 C 05/13/20 1505  Pulse 66 05/13/20 1506  Resp 13 05/13/20 1506  SpO2 100 % 05/13/20 1506  Vitals shown include unvalidated device data.  Last Pain:  Vitals:   05/13/20 1505  TempSrc: Temporal  PainSc: Asleep         Complications: No complications documented.

## 2020-05-13 NOTE — H&P (Signed)
Outpatient short stay form Pre-procedure 05/13/2020 1:01 PM [place] Soy K. Norma Fredrickson, M.D.  Primary Physician: Hannah Beat, M.D.  Reason for visit:  Colon cancer screening  History of present illness: Patient presents for colonoscopy for colon cancer screening. The patient denies complaints of abdominal pain, significant change in bowel habits, or rectal bleeding.     No current facility-administered medications for this encounter.  Current Outpatient Medications:  .  Calcium Carbonate (CALCIUM 600 PO), Take 1 tablet by mouth daily., Disp: , Rfl:  .  Multiple Vitamin (MULTIVITAMIN PO), Take 1 tablet by mouth daily.  , Disp: , Rfl:   No medications prior to admission.     No Known Allergies   Past Medical History:  Diagnosis Date  . Hepatitis A    years ago  . History of post-polio syndrome 1975   No residual weakness    Review of systems:  Otherwise negative.    Physical Exam  Gen: Alert, oriented. Appears stated age.  HEENT: [place]/AT. PERRLA. Lungs: CTA, no wheezes. CV: RR nl S1, S2. Abd: soft, benign, no masses. BS+ Ext: No edema. Pulses 2+    Planned procedures: Proceed with colonoscopy. The patient understands the nature of the planned procedure, indications, risks, alternatives and potential complications including but not limited to bleeding, infection, perforation, damage to internal organs and possible oversedation/side effects from anesthesia. The patient agrees and gives consent to proceed.  Please refer to procedure notes for findings, recommendations and patient disposition/instructions.     Demetrios Byron K. Norma Fredrickson, M.D. Gastroenterology 05/13/2020  1:01 PM

## 2020-05-13 NOTE — Anesthesia Preprocedure Evaluation (Signed)
Anesthesia Evaluation  Patient identified by MRN, date of birth, ID band Patient awake    Reviewed: Allergy & Precautions, NPO status , Patient's Chart, lab work & pertinent test results  Airway Mallampati: II  TM Distance: >3 FB     Dental   Pulmonary former smoker,    Pulmonary exam normal        Cardiovascular negative cardio ROS Normal cardiovascular exam     Neuro/Psych  Neuromuscular disease negative psych ROS   GI/Hepatic negative GI ROS, (+) Hepatitis -  Endo/Other  negative endocrine ROS  Renal/GU negative Renal ROS  negative genitourinary   Musculoskeletal negative musculoskeletal ROS (+)   Abdominal   Peds negative pediatric ROS (+)  Hematology negative hematology ROS (+)   Anesthesia Other Findings   Reproductive/Obstetrics                             Anesthesia Physical Anesthesia Plan  ASA: II  Anesthesia Plan: General   Post-op Pain Management:    Induction: Intravenous  PONV Risk Score and Plan:   Airway Management Planned: Nasal Cannula  Additional Equipment:   Intra-op Plan:   Post-operative Plan:   Informed Consent: I have reviewed the patients History and Physical, chart, labs and discussed the procedure including the risks, benefits and alternatives for the proposed anesthesia with the patient or authorized representative who has indicated his/her understanding and acceptance.     Dental advisory given  Plan Discussed with: CRNA and Surgeon  Anesthesia Plan Comments:         Anesthesia Quick Evaluation

## 2020-05-13 NOTE — Interval H&P Note (Signed)
History and Physical Interval Note:  05/13/2020 2:32 PM  Danny Cameron  has presented today for surgery, with the diagnosis of SCREENING.  The various methods of treatment have been discussed with the patient and family. After consideration of risks, benefits and other options for treatment, the patient has consented to  Procedure(s): COLONOSCOPY WITH PROPOFOL (N/A) as a surgical intervention.  The patient's history has been reviewed, patient examined, no change in status, stable for surgery.  I have reviewed the patient's chart and labs.  Questions were answered to the patient's satisfaction.     [place], [place]

## 2020-05-13 NOTE — Op Note (Signed)
[facility] Patient Name: Danny Cameron Procedure Date: 05/13/2020 2:31 PM MRN: [id number] Account #: [id number] Date of Birth: 1950/10/13 Admit Type: Outpatient Age: 70 Room: [facility] ENDO ROOM 2 Gender: Male Note Status: Finalized Procedure:             Colonoscopy Indications:           Screening for colorectal malignant neoplasm Providers:             Boykin Nearing. Bayden Gil MD, MD Medicines:             Propofol per Anesthesia Complications:         No immediate complications. Procedure:             Pre-Anesthesia Assessment:                        - The risks and benefits of the procedure and the                         sedation options and risks were discussed with the                         patient. All questions were answered and informed                         consent was obtained.                        - Patient identification and proposed procedure were                         verified prior to the procedure by the nurse. The                         procedure was verified in the procedure room.                        - ASA Grade Assessment: II - A patient with mild                         systemic disease.                        - After reviewing the risks and benefits, the patient                         was deemed in satisfactory condition to undergo the                         procedure.                        After obtaining informed consent, the colonoscope was                         passed under direct vision. Throughout the procedure,                         the patient's blood pressure, pulse, and oxygen  saturations were monitored continuously. The                         Colonoscope was introduced through the anus and                         advanced to the the cecum, identified by appendiceal                         orifice and ileocecal valve. The colonoscopy was                         performed  without difficulty. The patient tolerated                         the procedure well. The quality of the bowel                         preparation was good. The ileocecal valve, appendiceal                         orifice, and rectum were photographed. Findings:      The perianal and digital rectal examinations were normal. Pertinent       negatives include normal sphincter tone and no palpable rectal lesions.      A few medium-mouthed diverticula were found in the sigmoid colon.      Non-bleeding internal hemorrhoids were found during retroflexion. The       hemorrhoids were Grade I (internal hemorrhoids that do not prolapse).      The exam was otherwise without abnormality. Impression:            - Diverticulosis in the sigmoid colon.                        - Non-bleeding internal hemorrhoids.                        - The examination was otherwise normal.                        - No specimens collected. Recommendation:        - Patient has a contact number available for                         emergencies. The signs and symptoms of potential                         delayed complications were discussed with the patient.                         Return to normal activities tomorrow. Written                         discharge instructions were provided to the patient.                        - Resume previous diet.                        - Continue  present medications.                        - Repeat colonoscopy in 10 years for screening                         purposes.                        - Return to GI office PRN.                        - The findings and recommendations were discussed with                         the patient. Procedure Code(s):     --- Professional ---                        M4680, Colorectal cancer screening; colonoscopy on                         individual not meeting criteria for high risk Diagnosis Code(s):     --- Professional ---                         K57.30, Diverticulosis of large intestine without                         perforation or abscess without bleeding                        K64.0, First degree hemorrhoids                        Z12.11, Encounter for screening for malignant neoplasm                         of colon CPT copyright 2019 American Medical Association. All rights reserved. The codes documented in this report are preliminary and upon coder review may  be revised to meet current compliance requirements. Stanton Kidney MD, MD 05/13/2020 3:03:37 PM This report has been signed electronically. Number of Addenda: 0 Note Initiated On: 05/13/2020 2:31 PM Scope Withdrawal Time: 0 hours 5 minutes 57 seconds  Total Procedure Duration: 0 hours 10 minutes 50 seconds  Estimated Blood Loss:  Estimated blood loss: none.      [facility]

## 2020-05-14 NOTE — Anesthesia Postprocedure Evaluation (Signed)
Anesthesia Post Note  Patient: Danny Cameron  Procedure(s) Performed: COLONOSCOPY WITH PROPOFOL (N/A )  Patient location during evaluation: Endoscopy Anesthesia Type: General Level of consciousness: awake and alert and oriented Pain management: pain level controlled Vital Signs Assessment: post-procedure vital signs reviewed and stable Respiratory status: spontaneous breathing Cardiovascular status: blood pressure returned to baseline Anesthetic complications: no   No complications documented.   Last Vitals:  Vitals:   05/13/20 1515 05/13/20 1525  BP: 102/67 131/84  Pulse: (!) 59 62  Resp: 14 (!) 21  Temp:    SpO2: 99% 99%    Last Pain:  Vitals:   05/13/20 1525  TempSrc:   PainSc: 0-No pain                 Keilan Nichol

## 2020-05-23 DIAGNOSIS — H353131 Nonexudative age-related macular degeneration, bilateral, early dry stage: Secondary | ICD-10-CM | POA: Diagnosis not present

## 2020-05-23 DIAGNOSIS — H524 Presbyopia: Secondary | ICD-10-CM | POA: Diagnosis not present

## 2020-05-27 ENCOUNTER — Telehealth: Payer: Self-pay | Admitting: *Deleted

## 2020-05-27 NOTE — Telephone Encounter (Signed)
Patient call stating that his eye doctor has added a new pill called Preservision ARED for slight macular degeneration. Patient stated that he read up on this on the internet and there were lots of side effects. Patient stated that he spoke with the pharmacist and was advised that he should be okay taking it. Patient stated that he likes for Dr. Patsy Lager to be aware of everything that he takes and if Dr. Patsy Lager thinks that he should not take it call him back.

## 2020-05-27 NOTE — Telephone Encounter (Signed)
Often used for macular degeneration

## 2020-09-02 ENCOUNTER — Ambulatory Visit: Payer: Medicare HMO | Admitting: Family Medicine

## 2020-09-03 NOTE — Progress Notes (Signed)
Bonniejean Piano T. Westen Dinino, MD, CAQ Sports Medicine [facility] at [facility] [address] [place], [address]  Phone: [phone]  FAX: [phone]  Zhi Geier - 70 y.o. male  MRN [id number]  Date of Birth: 1950/12/04  Date: 09/04/2020  PCP: Hannah Beat, MD  Referral: Hannah Beat, MD  Chief Complaint  Patient presents with   Shoulder Pain    Right    This visit occurred during the SARS-CoV-2 public health emergency.  Safety protocols were in place, including screening questions prior to the visit, additional usage of staff PPE, and extensive cleaning of exam room while observing appropriate contact time as indicated for disinfecting solutions.   Subjective:   Agron Swiney is a 70 y.o. very pleasant male patient with Body mass index is 21.97 kg/m. who presents with the following:  He is a well-known primary care patient.  He is extremely active for age, and he is a [employer] administrator.  He has had some RTC tendonitis every so often in the R shoulder, and he has been diligent in doing some RTC strengthening and scapular stabilization by HEP.  Some crepitus and grinding.  He still continues to be an avid swimmer and he often swims a mile or more several times a week. He does have some pain with particular strokes, but right now he rates his pain at about 2 out of 10.  Review of Systems is noted in the HPI, as appropriate   Objective:   BP 100/60   Pulse 76   Temp 98.5 F (36.9 C) (Temporal)   Ht 5' 3.5" (1.613 m)   Wt 126 lb (57.2 kg)   SpO2 97%   BMI 21.97 kg/m    Shoulder: r Inspection: No muscle wasting or winging Ecchymosis/edema: neg  AC joint, scapula, clavicle: NT Cervical spine: NT, full ROM Spurling's: neg Abduction: full, 5/5 Flexion: full, 5/5 IR, full, lift-off: 5/5 ER at neutral: full, 5/5 AC crossover: neg Neer: Negative Hawkins: Minimally positive Drop Test: neg Empty Can: Minimal Supraspinatus  insertion: Normal Bicipital groove: NT Speed's: neg Yergason's: neg Sulcus sign: neg Scapular dyskinesis: none C5-T1 intact  Neuro: Sensation intact Grip 5/5 r  Radiology: DG Shoulder Right  Result Date: 09/05/2020 CLINICAL DATA:  Chronic right shoulder pain. EXAM: RIGHT SHOULDER - 2+ VIEW COMPARISON:  None. FINDINGS: There is no evidence of fracture or dislocation. Mild acromioclavicular degenerative change with inferiorly directed osteophytes. Trace inferior glenohumeral spurring. Mild subcortical cystic change in the lateral humeral head. Soft tissues are unremarkable. IMPRESSION: 1. Mild acromioclavicular and minimal glenohumeral osteoarthritis. 2. Mild subcortical cystic change in the lateral humeral head, suggesting rotator cuff pathology. Electronically Signed   By: Narda Rutherford M.D.   On: 09/05/2020 19:26     Assessment and Plan:     ICD-10-CM   1. Chronic right shoulder pain  M25.511 DG Shoulder Right   G89.29     2. Impingement syndrome of right shoulder  M75.41      I am not sure that I would do anything aside from some continued rotator cuff strengthening and scapular stabilization.  His exam is pretty benign, and he does not have any significant glenohumeral arthritis.  Continue his daily fitness regime.  Orders Placed This Encounter  Procedures   DG Shoulder Right    Follow-up: No follow-ups on file.  Signed,  Elpidio Galea. Ayerim Berquist, MD   Outpatient Encounter Medications as of 09/04/2020  Medication Sig   Calcium Carbonate (CALCIUM 600 PO) Take  1 tablet by mouth daily.   Multiple Vitamin (MULTIVITAMIN PO) Take 1 tablet by mouth daily.   Multiple Vitamins-Minerals (PRESERVISION AREDS 2 PO) Take 1 tablet by mouth daily.   No facility-administered encounter medications on file as of 09/04/2020.

## 2020-09-04 ENCOUNTER — Other Ambulatory Visit: Payer: Self-pay

## 2020-09-04 ENCOUNTER — Encounter: Payer: Self-pay | Admitting: Family Medicine

## 2020-09-04 ENCOUNTER — Ambulatory Visit (INDEPENDENT_AMBULATORY_CARE_PROVIDER_SITE_OTHER): Payer: Medicare HMO | Admitting: Family Medicine

## 2020-09-04 ENCOUNTER — Ambulatory Visit (INDEPENDENT_AMBULATORY_CARE_PROVIDER_SITE_OTHER)
Admission: RE | Admit: 2020-09-04 | Discharge: 2020-09-04 | Disposition: A | Discharge status: Home / Self Care | Payer: Medicare HMO | Source: Ambulatory Visit | Attending: Family Medicine | Admitting: Family Medicine

## 2020-09-04 VITALS — BP 100/60 | HR 76 | Temp 98.5°F | Ht 63.5 in | Wt 126.0 lb

## 2020-09-04 DIAGNOSIS — G8929 Other chronic pain: Secondary | ICD-10-CM | POA: Diagnosis not present

## 2020-09-04 DIAGNOSIS — M25511 Pain in right shoulder: Secondary | ICD-10-CM | POA: Diagnosis not present

## 2020-09-04 DIAGNOSIS — M7541 Impingement syndrome of right shoulder: Secondary | ICD-10-CM | POA: Diagnosis not present

## 2020-09-21 DIAGNOSIS — Z0100 Encounter for examination of eyes and vision without abnormal findings: Secondary | ICD-10-CM | POA: Diagnosis not present

## 2020-10-11 ENCOUNTER — Telehealth: Payer: Self-pay | Admitting: *Deleted

## 2020-10-11 NOTE — Telephone Encounter (Signed)
I would keep a compression sleeve on the knee at least through the weekend or longer if continues to hurt.  Should help with swelling.  Ice twice a day for 20 minutes each time. Ibuprofen 3 times a day.  3 over the counter tablets at a time.

## 2020-10-11 NOTE — Telephone Encounter (Signed)
Luisa Hart notified as instructed by telephone.  Patient states understanding.

## 2020-10-11 NOTE — Telephone Encounter (Signed)
Claborn left voicemail on triage line stating he fell the other day and slipped on his knee.  It is swollen but not a lot a pain so he doesn't think anything is broken.  He used ice packs on it yesterday and it was a little better today but now that he is up on it, the swelling is coming back.  Not any bigger but about the same as it was.  He is asking for additional suggestions on what he needs to be doing.  Please advise.

## 2020-11-18 DIAGNOSIS — H353131 Nonexudative age-related macular degeneration, bilateral, early dry stage: Secondary | ICD-10-CM | POA: Diagnosis not present

## 2020-12-09 ENCOUNTER — Other Ambulatory Visit: Payer: Self-pay | Admitting: Family Medicine

## 2020-12-09 DIAGNOSIS — Z0000 Encounter for general adult medical examination without abnormal findings: Secondary | ICD-10-CM

## 2020-12-09 DIAGNOSIS — Z131 Encounter for screening for diabetes mellitus: Secondary | ICD-10-CM

## 2020-12-09 DIAGNOSIS — Z125 Encounter for screening for malignant neoplasm of prostate: Secondary | ICD-10-CM

## 2020-12-11 ENCOUNTER — Other Ambulatory Visit: Payer: Medicare HMO

## 2020-12-16 ENCOUNTER — Other Ambulatory Visit: Payer: Self-pay

## 2020-12-16 ENCOUNTER — Encounter: Payer: Medicare HMO | Admitting: Family Medicine

## 2020-12-16 ENCOUNTER — Ambulatory Visit (INDEPENDENT_AMBULATORY_CARE_PROVIDER_SITE_OTHER): Payer: Medicare HMO | Admitting: Family Medicine

## 2020-12-16 ENCOUNTER — Encounter: Payer: Self-pay | Admitting: Family Medicine

## 2020-12-16 DIAGNOSIS — Z0000 Encounter for general adult medical examination without abnormal findings: Secondary | ICD-10-CM | POA: Diagnosis not present

## 2020-12-16 DIAGNOSIS — Z125 Encounter for screening for malignant neoplasm of prostate: Secondary | ICD-10-CM | POA: Diagnosis not present

## 2020-12-16 LAB — LIPID PANEL
Cholesterol: 192 mg/dL (ref 0–200)
HDL: 62.4 mg/dL (ref >39.00)
LDL Cholesterol: 118 mg/dL — ABNORMAL HIGH (ref 0–99)
NonHDL: 129.71
Total CHOL/HDL Ratio: 3
Triglycerides: 61 mg/dL (ref 0.0–149.0)
VLDL: 12.2 mg/dL (ref 0.0–40.0)

## 2020-12-16 LAB — CBC WITH DIFFERENTIAL/PLATELET
Basophils Absolute: 0.1 10*3/uL (ref 0.0–0.1)
Basophils Relative: 1.3 % (ref 0.0–3.0)
Eosinophils Absolute: 0.3 10*3/uL (ref 0.0–0.7)
Eosinophils Relative: 3.9 % (ref 0.0–5.0)
HCT: 43.7 % (ref 39.0–52.0)
Hemoglobin: 15 g/dL (ref 13.0–17.0)
Lymphocytes Relative: 20.1 % (ref 12.0–46.0)
Lymphs Abs: 1.4 10*3/uL (ref 0.7–4.0)
MCHC: 34.2 g/dL (ref 30.0–36.0)
MCV: 96.3 fl (ref 78.0–100.0)
Monocytes Absolute: 0.6 10*3/uL (ref 0.1–1.0)
Monocytes Relative: 8.3 % (ref 3.0–12.0)
Neutro Abs: 4.6 10*3/uL (ref 1.4–7.7)
Neutrophils Relative %: 66.4 % (ref 43.0–77.0)
Platelets: 197 10*3/uL (ref 150.0–400.0)
RBC: 4.54 Mil/uL (ref 4.22–5.81)
RDW: 13.3 % (ref 11.5–15.5)
WBC: 7 10*3/uL (ref 4.0–10.5)

## 2020-12-16 LAB — HEPATIC FUNCTION PANEL
ALT: 16 U/L (ref 0–53)
AST: 23 U/L (ref 0–37)
Albumin: 4.3 g/dL (ref 3.5–5.2)
Alkaline Phosphatase: 65 U/L (ref 39–117)
Bilirubin, Direct: 0.2 mg/dL (ref 0.0–0.3)
Total Bilirubin: 1.4 mg/dL — ABNORMAL HIGH (ref 0.2–1.2)
Total Protein: 6.9 g/dL (ref 6.0–8.3)

## 2020-12-16 LAB — BASIC METABOLIC PANEL
BUN: 17 mg/dL (ref 6–23)
CO2: 31 mEq/L (ref 19–32)
Calcium: 9.2 mg/dL (ref 8.4–10.5)
Chloride: 102 mEq/L (ref 96–112)
Creatinine, Ser: 0.75 mg/dL (ref 0.40–1.50)
GFR: 91.93 mL/min (ref >60.00)
Glucose, Bld: 83 mg/dL (ref 70–99)
Potassium: 4.3 mEq/L (ref 3.5–5.1)
Sodium: 139 mEq/L (ref 135–145)

## 2020-12-16 LAB — PSA, MEDICARE: PSA: 1.08 ng/ml (ref 0.10–4.00)

## 2020-12-16 NOTE — Progress Notes (Signed)
Danny Cameron T. Jiselle Sheu, MD, CAQ Sports Medicine [facility] [address] [place], [address]  Phone: [phone]  FAX: [phone]  [name] - 70 y.o. male  MRN [id number]  Date of Birth: 15-Dec-1950  Date: 12/16/2020  PCP: Hannah Beat, MD  Referral: Hannah Beat, MD  Chief Complaint  Patient presents with   Medicare Wellness    This visit occurred during the SARS-CoV-2 public health emergency.  Safety protocols were in place, including screening questions prior to the visit, additional usage of staff PPE, and extensive cleaning of exam room while observing appropriate contact time as indicated for disinfecting solutions.   Patient Care Team: Hannah Beat, MD as PCP - General (Family Medicine) Subjective:   Danny Cameron is a 70 y.o. pleasant patient who presents for a medicare wellness examination:  Preventative Health Maintenance Visit:  Health Maintenance Summary Reviewed and updated, unless pt declines services.  Tobacco History Reviewed. Alcohol: No concerns, no excessive use -about 1 beer a day Exercise Habits: Some activity, rec at least 30 mins 5 times a week STD concerns: no risk or activity to increase risk Drug Use: None  Shingrix  4 days a week at the [facility].  Doing great - feeling better.  40 miles on bike [employer] about four days a week.   Travelling, wife just retired.   SEND PAPER COPIES    Health Maintenance  Topic Date Due   Zoster Vaccines- Shingrix (1 of 2) Never done   INFLUENZA VACCINE  10/28/2020   COVID-19 Vaccine (5 - Booster for Pfizer series) 11/25/2020   Hepatitis C Screening  09/14/2048 (Originally 03/22/1969)   TETANUS/TDAP  12/21/2027   COLONOSCOPY (Pts 45-32yrs Insurance coverage will need to be confirmed)  05/13/2030   HPV VACCINES  Aged Out    Immunization History  Administered Date(s) Administered   Fluad Quad(high Dose 65+) 11/29/2018,  12/11/2019   Influenza, High Dose Seasonal PF 01/04/2017   Influenza,inj,Quad PF,6+ Mos 12/08/2017   PFIZER(Purple Top)SARS-COV-2 Vaccination 04/15/2019, 05/06/2019, 01/12/2020, 07/26/2020   Pneumococcal Conjugate-13 04/20/2016   Pneumococcal Polysaccharide-23 05/26/2017   Tdap 12/20/2017   Zoster, Live 11/17/2011    Patient Active Problem List   Diagnosis Date Noted   Post-polio syndrome, no weakness 03/07/2013    Past Medical History:  Diagnosis Date   Hepatitis A    years ago   History of post-polio syndrome 1975   No residual weakness    Past Surgical History:  Procedure Laterality Date   COLONOSCOPY  05/2011   COLONOSCOPY WITH PROPOFOL N/A 05/13/2020   Procedure: COLONOSCOPY WITH PROPOFOL;  Surgeon: Toledo, Boykin Nearing, MD;  Location: [facility] ENDOSCOPY;  Service: Gastroenterology;  Laterality: N/A;    Family History  Problem Relation Age of Onset   Heart failure Father     Past Medical History, Surgical History, Social History, Family History, Problem List, Medications, and Allergies have been reviewed and updated if relevant.  Review of Systems: Pertinent positives are listed above.  Otherwise, a full 14 point review of systems has been done in full and it is negative except where it is noted positive.  Objective:   BP 120/80   Pulse (!) 57   Temp 98.4 F (36.9 C) (Temporal)   Ht 5' 3.25" (1.607 m)   Wt 121 lb 8 oz (55.1 kg)   SpO2 98%   BMI 21.35 kg/m  Fall Risk  12/16/2020 12/11/2019 01/09/2019 09/14/2017 04/20/2016  Falls in the past year? 0 0 0  No No   Ideal Body Weight: Weight in (lb) to have BMI = 25: 142 Hearing Screening  Method: Audiometry   500Hz  1000Hz  2000Hz  4000Hz   Right ear 20 20 20 20   Left ear 20 20 20 20   Comments: Wear Glasses-Eye Exam with Dr. in [facility]  Depression screen [facility] 2/9 12/16/2020 12/11/2019 01/09/2019 09/14/2017 04/20/2016  Decreased Interest 0 0 0 0 0  Down, Depressed, Hopeless 0 0 0 0 0  PHQ - 2 Score 0 0 0 0 0  Altered  sleeping - - - 0 -  Tired, decreased energy - - - 0 -  Change in appetite - - - 0 -  Feeling bad or failure about yourself  - - - 0 -  Trouble concentrating - - - 0 -  Moving slowly or fidgety/restless - - - 0 -  Suicidal thoughts - - - 0 -  PHQ-9 Score - - - 0 -  Difficult doing work/chores - - - Not difficult at all -     GEN: well developed, well nourished, no acute distress Eyes: conjunctiva and lids normal, PERRLA, EOMI ENT: TM clear, nares clear, oral exam WNL Neck: supple, no lymphadenopathy, no thyromegaly, no JVD Pulm: clear to auscultation and percussion, respiratory effort normal CV: regular rate and rhythm, S1-S2, no murmur, rub or gallop, no bruits, peripheral pulses normal and symmetric, no cyanosis, clubbing, edema or varicosities GI: soft, non-tender; no hepatosplenomegaly, masses; active bowel sounds all quadrants GU: deferred Lymph: no cervical, axillary or inguinal adenopathy MSK: gait normal, muscle tone and strength WNL, no joint swelling, effusions, discoloration, crepitus  SKIN: clear, good turgor, color WNL, no rashes, lesions, or ulcerations Neuro: normal mental status, normal strength, sensation, and motion Psych: alert; oriented to person, place and time, normally interactive and not anxious or depressed in appearance.  All labs reviewed with patient.  Results for orders placed or performed during the hospital encounter of 05/10/20  SARS CORONAVIRUS 2 (TAT 6-24 HRS) Nasopharyngeal Nasopharyngeal Swab   Specimen: Nasopharyngeal Swab  Result Value Ref Range   SARS Coronavirus 2 NEGATIVE NEGATIVE    Assessment and Plan:     ICD-10-CM   1. Routine general medical examination at a health care facility  Z00.00 PSA, Medicare    CBC with Differential/Platelet    Basic metabolic panel    Hepatic function panel    Lipid panel    CANCELED: Hemoglobin A1c    2. Special screening for malignant neoplasm of prostate  Z12.5 PSA, Medicare     He is really  doing very well.  I have no concerns.  I encouraged him to continue eating well and exercising.  We will check his basic labs today and send him a paper copy.  Patient Instructions  Bivalent Covid-19  Check with your insurance to see if they will cover the shingles shot.  The newer [facility] shot is much better than the older shot. The [facility] shot requires 2 shots given 6 months apart.  Almost always, this shot is not covered in our office by your insurance. It costs 500 dollars, but essentially all insurances cover it at your pharmacy.  I would call your insurance number on your card to confirm this.    Health Maintenance Exam: The patient's preventative maintenance and recommended screening tests for an annual wellness exam were reviewed in full today. Brought up to date unless services declined.  Counselled on the importance of diet, exercise, and its role in overall health and mortality.  The patient's FH and SH was reviewed, including their home life, tobacco status, and drug and alcohol status.  Follow-up in 1 year for physical exam or additional follow-up below.  I have personally reviewed the Medicare Annual Wellness questionnaire and have noted 1. The patient's medical and social history 2. Their use of alcohol, tobacco or illicit drugs 3. Their current medications and supplements 4. The patient's functional ability including ADL's, fall risks, home safety risks and hearing or visual             impairment. 5. Diet and physical activities 6. Evidence for depression or mood disorders 7. Reviewed Updated provider list, see scanned forms and [facility] Snapshot.  8. Reviewed whether or not the patient has HCPOA or living will, and discussed what this means with the patient.  Recommended he bring in a copy for his chart in [facility].  The patients weight, height, BMI and visual acuity have been recorded in the chart I have made referrals, counseling and provided education to the patient  based review of the above and I have provided the pt with a written personalized care plan for preventive services.  I have provided the patient with a copy of your personalized plan for preventive services. Instructed to take the time to review along with their updated medication list.  Follow-up: No follow-ups on file. Or follow-up in 1 year if not noted.  No future appointments.  No orders of the defined types were placed in this encounter.  There are no discontinued medications. No orders of the defined types were placed in this encounter.   Signed,  Elpidio Galea. Rayhan Groleau, MD   Allergies as of 12/16/2020   No Known Allergies      Medication List        Accurate as of December 16, 2020 10:23 AM. If you have any questions, ask your nurse or doctor.          CALCIUM 600 PO Take 1 tablet by mouth daily.   MULTIVITAMIN PO Take 1 tablet by mouth daily.   PRESERVISION AREDS 2 PO Take 1 tablet by mouth daily.

## 2020-12-16 NOTE — Patient Instructions (Addendum)
Bivalent Covid-19  Check with your insurance to see if they will cover the shingles shot.  The newer [facility] shot is much better than the older shot. The [facility] shot requires 2 shots given 6 months apart.  Almost always, this shot is not covered in our office by your insurance. It costs 500 dollars, but essentially all insurances cover it at your pharmacy.  I would call your insurance number on your card to confirm this.

## 2020-12-27 ENCOUNTER — Telehealth: Payer: Self-pay | Admitting: Family Medicine

## 2020-12-27 NOTE — Telephone Encounter (Signed)
Pt called stating that he would like the result of his blood work on paper and mailed to him.

## 2020-12-30 NOTE — Telephone Encounter (Signed)
Can you call and apologize.  I thought that we had already done this, but it might have been misplaced during our move.  Please send the following with a copy of his labs.    "All of your labs look stable and normal.     The computer marks an isolated elevated Bilirubin, but this basically does not matter at all.  Even if elevated long-term, it would never cause a problem.   Your cholesterol is fine.  Your good (HDL) cholesterol is very nice.  The marked LDL makes minimal difference with a normal total and great HDL.   Keep working out and eating well!"

## 2021-01-01 NOTE — Telephone Encounter (Signed)
Left detailed message for patient stating we mailed information and requested and apologized for the delay

## 2021-04-10 ENCOUNTER — Encounter: Payer: Medicare HMO | Admitting: Family Medicine

## 2021-04-21 ENCOUNTER — Telehealth: Payer: Self-pay

## 2021-04-21 NOTE — Telephone Encounter (Signed)
I was speaking with pts wife on phone about her appt needs and after finishing speaking with pts wife Ms Irine Seal said that her husband wanted to speak with me. I was finishing Mrs Hackney's phone note and asked the pt to hold for a few mins and I would be right back to speak with him. When I went back on the call there was no one on phone. I called back to pts home and Mrs Bing Neighbors said pt had somewhere he had to be and could not hold on phone. I asked Mrs Bing Neighbors to ask pt to cb to schedule an appt. And Mrs Bing Neighbors voiced understanding and will let pt know. This note already sent to Dr Windell Moulding CMA and [facility] support.

## 2021-04-21 NOTE — Telephone Encounter (Signed)
I spoke with pt; pt said he was on the phone with the ins co and pt will cb for appt. Sending note to Dr Lorelei Pont and Butch Penny CMA and [facility] support.
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
## 2021-04-28 ENCOUNTER — Ambulatory Visit (INDEPENDENT_AMBULATORY_CARE_PROVIDER_SITE_OTHER): Payer: Medicare HMO | Admitting: Family Medicine

## 2021-04-28 ENCOUNTER — Encounter: Payer: Self-pay | Admitting: Family Medicine

## 2021-04-28 ENCOUNTER — Other Ambulatory Visit: Payer: Self-pay

## 2021-04-28 VITALS — BP 110/90 | HR 93 | Temp 98.0°F | Ht 63.25 in | Wt 128.0 lb

## 2021-04-28 DIAGNOSIS — M542 Cervicalgia: Secondary | ICD-10-CM | POA: Diagnosis not present

## 2021-04-28 DIAGNOSIS — M25511 Pain in right shoulder: Secondary | ICD-10-CM

## 2021-04-28 DIAGNOSIS — M545 Low back pain, unspecified: Secondary | ICD-10-CM | POA: Diagnosis not present

## 2021-04-28 DIAGNOSIS — M25512 Pain in left shoulder: Secondary | ICD-10-CM
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
## 2021-05-02 ENCOUNTER — Telehealth: Payer: Self-pay | Admitting: Family Medicine

## 2021-05-02 NOTE — Telephone Encounter (Signed)
Can we do this with patients verbal consent like this? Just one note

## 2021-05-02 NOTE — Telephone Encounter (Signed)
Pt called asking if you could mail OV notes that he had on 04/28/21 to his address. Please advise.

## 2021-05-05 ENCOUNTER — Telehealth: Payer: Self-pay | Admitting: Family Medicine

## 2021-05-05 NOTE — Telephone Encounter (Signed)
And he couldn't sign it anywhere
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
## 2021-05-05 NOTE — Telephone Encounter (Signed)
Copy of last office note mailed to patient.  Okay per Dr. Patsy Lager.

## 2021-05-09 NOTE — Telephone Encounter (Signed)
We can send one office visit note ONLY if we have a signed consent from the patient for record release.

## 2021-05-09 NOTE — Telephone Encounter (Signed)
Office note was mailed to patient on 05/05/21 per Dr. Lillie Fragmin okay.  See phone encounter from 05/05/2021.

## 2021-05-13 DIAGNOSIS — H353131 Nonexudative age-related macular degeneration, bilateral, early dry stage: Secondary | ICD-10-CM | POA: Diagnosis not present

## 2021-05-13 DIAGNOSIS — H524 Presbyopia: Secondary | ICD-10-CM | POA: Diagnosis not present
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
## 2021-05-26 ENCOUNTER — Encounter: Payer: Self-pay | Admitting: Family Medicine

## 2021-05-26 ENCOUNTER — Ambulatory Visit (INDEPENDENT_AMBULATORY_CARE_PROVIDER_SITE_OTHER): Payer: Medicare HMO | Admitting: Family Medicine

## 2021-05-26 ENCOUNTER — Other Ambulatory Visit: Payer: Self-pay

## 2021-05-26 VITALS — BP 120/76 | HR 70 | Temp 97.6°F | Ht 63.25 in | Wt 134.2 lb

## 2021-05-26 DIAGNOSIS — M25511 Pain in right shoulder: Secondary | ICD-10-CM

## 2021-05-26 DIAGNOSIS — M545 Low back pain, unspecified: Secondary | ICD-10-CM

## 2021-05-26 DIAGNOSIS — M25512 Pain in left shoulder: Secondary | ICD-10-CM

## 2021-05-26 DIAGNOSIS — M542 Cervicalgia: Secondary | ICD-10-CM | POA: Diagnosis not present

## 2021-05-27 NOTE — Progress Notes (Signed)
Copy of office note mailed to patient as instructed by Dr. Lorelei Pont.

## 2021-06-06 ENCOUNTER — Telehealth: Payer: Self-pay

## 2021-06-06 NOTE — Telephone Encounter (Signed)
Danny Cameron notified by telephone that I did mail him a copy of his last office note but I will also place copy up front for patient to pick up at his convenience.  ?

## 2021-06-06 NOTE — Telephone Encounter (Signed)
Patient states that he has not received the visit information on 2/27 for his insurance. Would like for it to be placed at reception for pick up. Please call and let know when ready for pick up.  ?

## 2021-07-14 ENCOUNTER — Telehealth: Payer: Self-pay | Admitting: Family Medicine

## 2021-08-23 IMAGING — DX DG SHOULDER 2+V*R*
3 series · 3 of 3 positions shown · non-contrast
Comparison: None.

CLINICAL DATA: Chronic right shoulder pain.

EXAM:
RIGHT SHOULDER - 2+ VIEW

[shoulder axial]
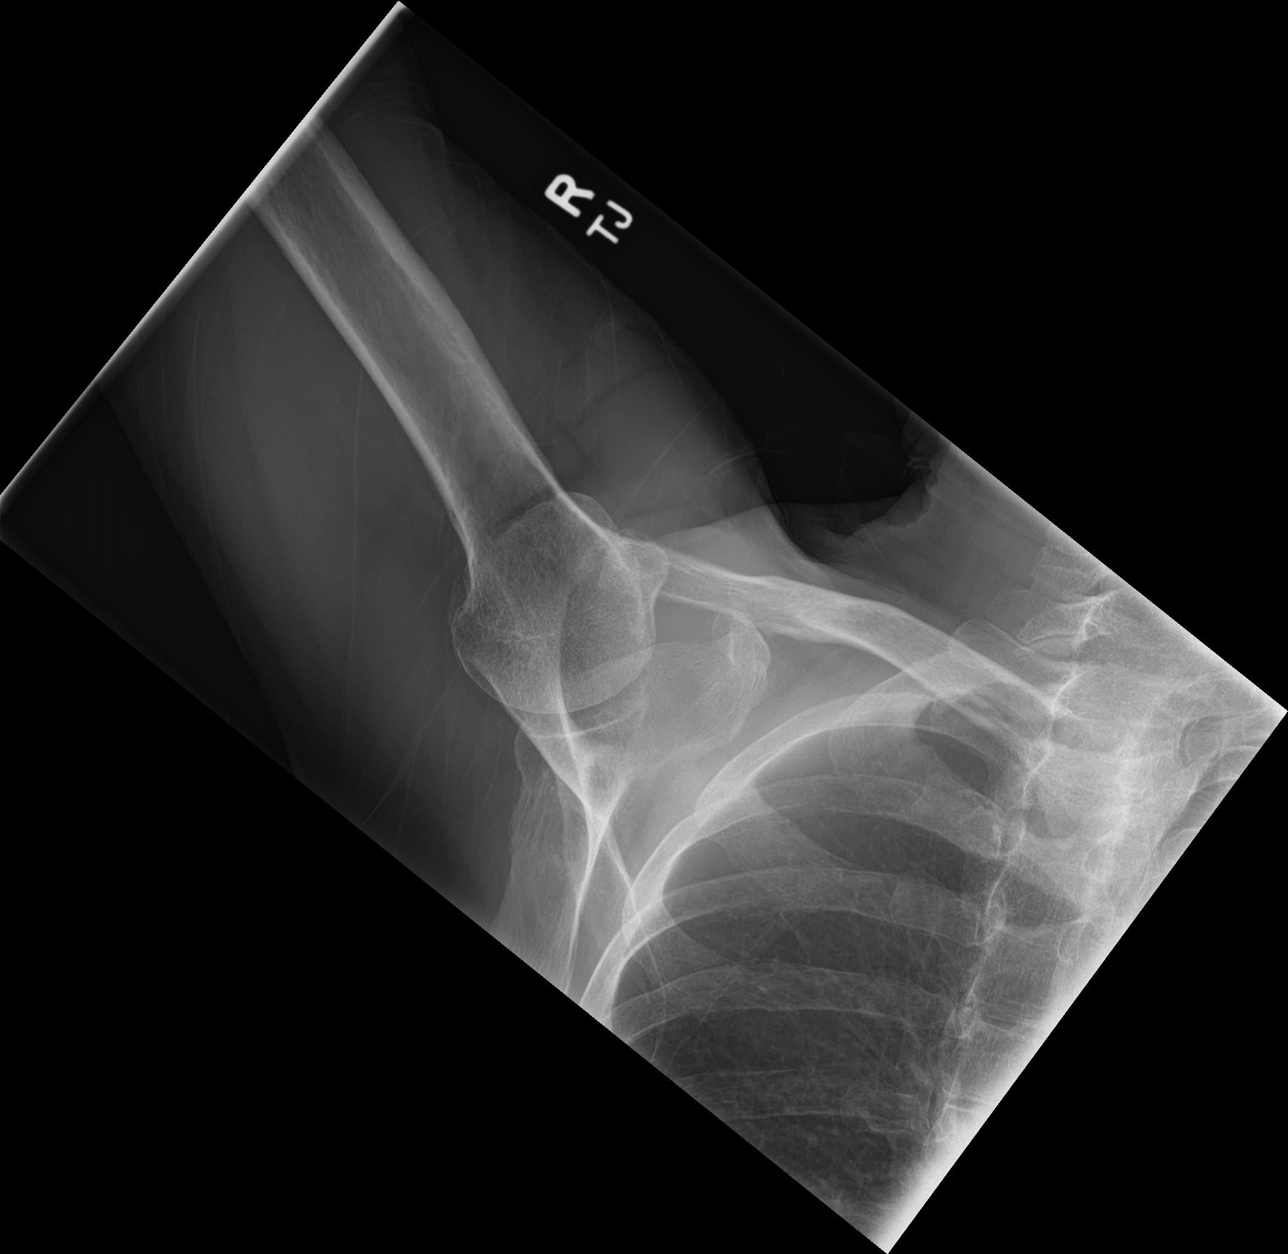

[shoulder obl]
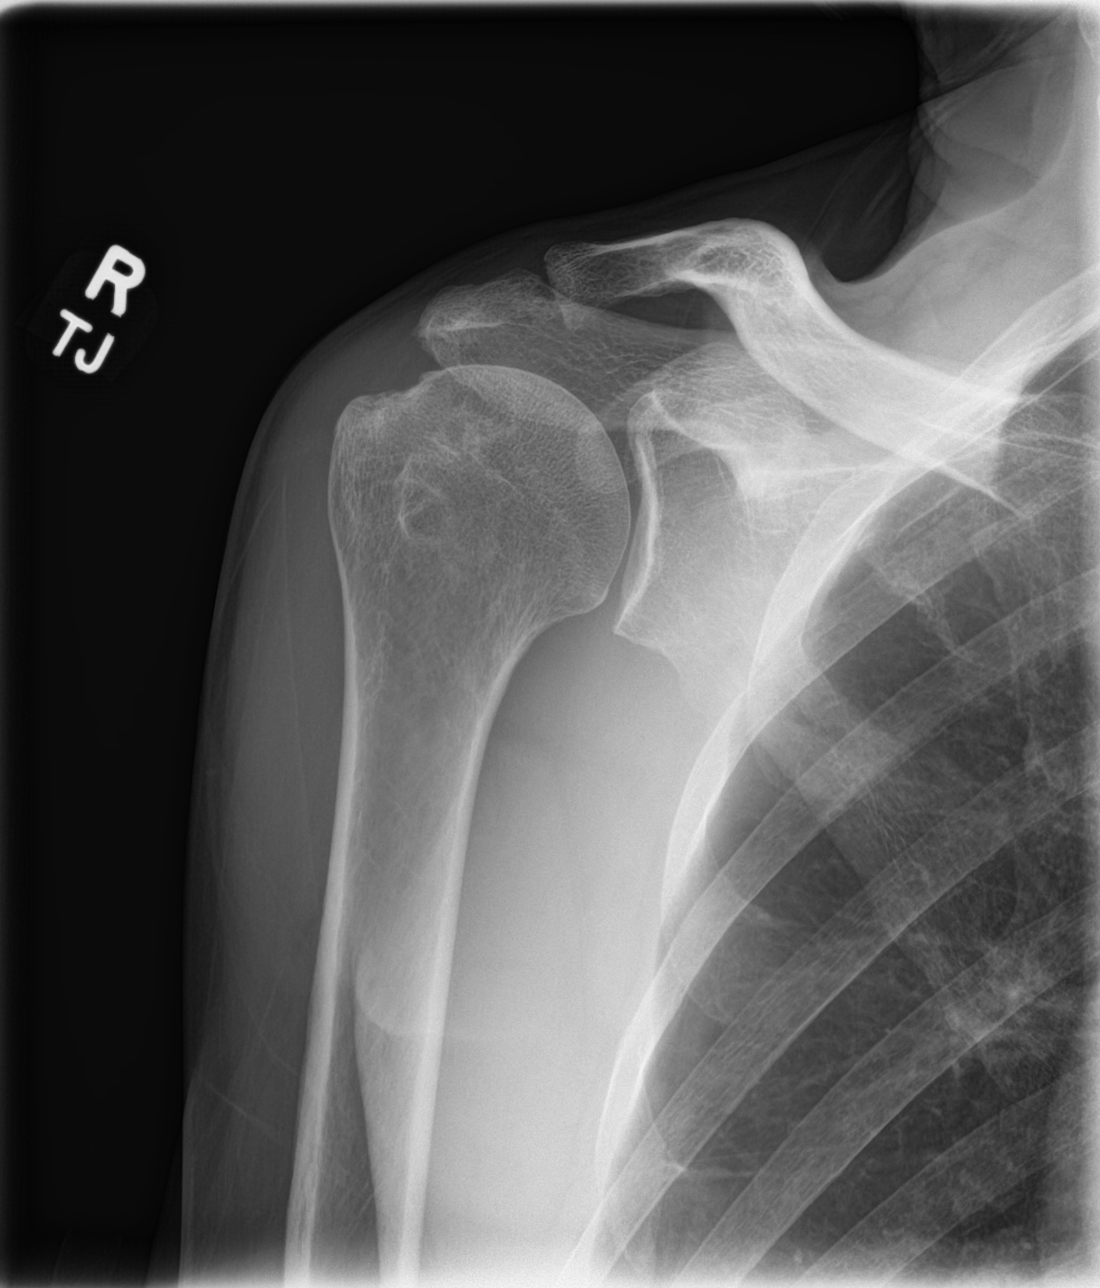

[shoulder y-view]
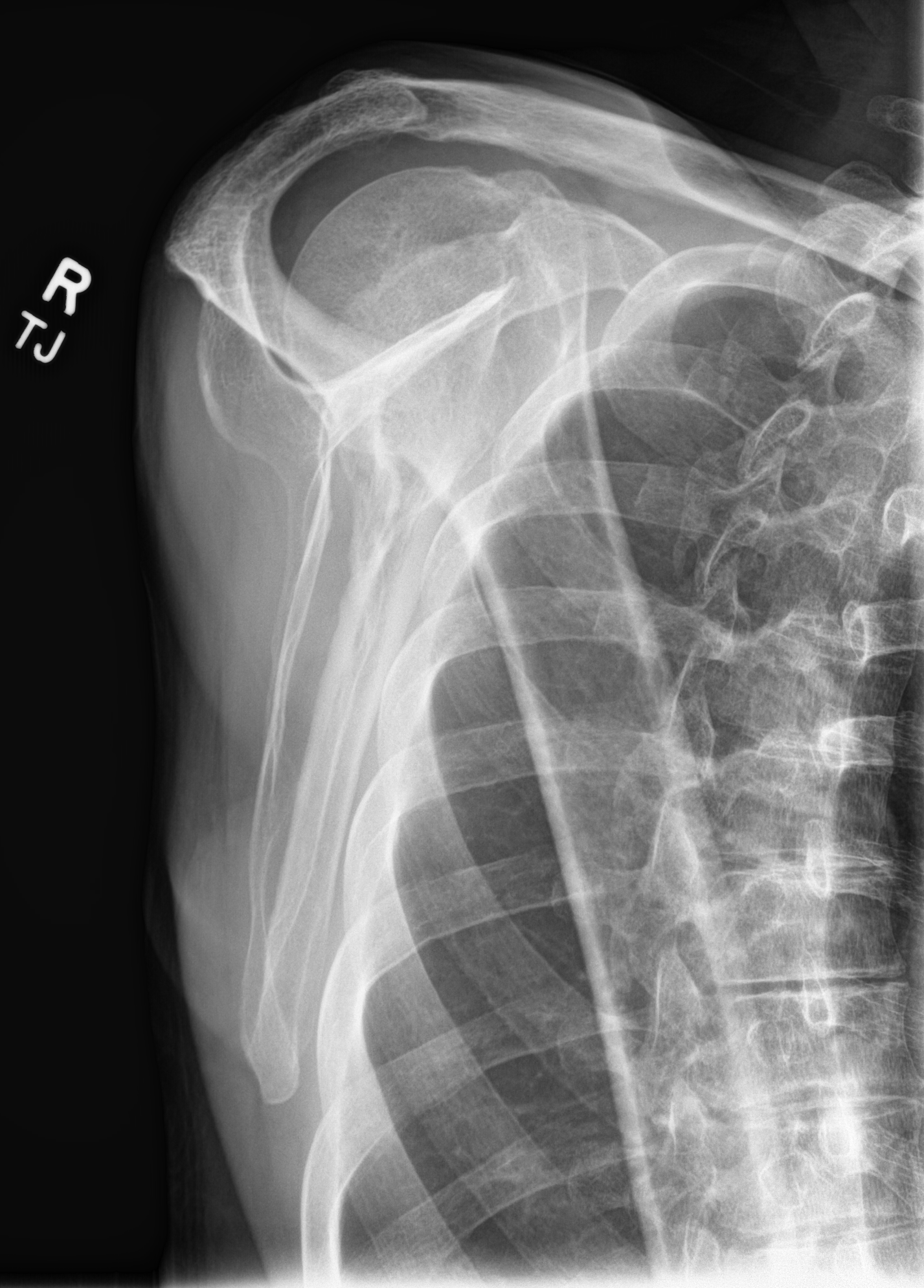

[3 of 3 positions shown; findings below may reference images not displayed]

FINDINGS: There is no evidence of fracture or dislocation. Mild
acromioclavicular degenerative change with inferiorly directed
osteophytes. Trace inferior glenohumeral spurring. Mild subcortical
cystic change in the lateral humeral head. Soft tissues are
unremarkable.
IMPRESSION: 1. Mild acromioclavicular and minimal glenohumeral osteoarthritis.
2. Mild subcortical cystic change in the lateral humeral head,
suggesting rotator cuff pathology.

## 2021-08-30 DIAGNOSIS — Z0100 Encounter for examination of eyes and vision without abnormal findings: Secondary | ICD-10-CM | POA: Diagnosis not present

## 2021-11-11 DIAGNOSIS — H353131 Nonexudative age-related macular degeneration, bilateral, early dry stage: Secondary | ICD-10-CM | POA: Diagnosis not present

## 2021-12-15 ENCOUNTER — Other Ambulatory Visit (INDEPENDENT_AMBULATORY_CARE_PROVIDER_SITE_OTHER): Payer: Medicare HMO

## 2021-12-15 DIAGNOSIS — Z125 Encounter for screening for malignant neoplasm of prostate: Secondary | ICD-10-CM | POA: Diagnosis not present

## 2021-12-15 DIAGNOSIS — Z0000 Encounter for general adult medical examination without abnormal findings: Secondary | ICD-10-CM | POA: Diagnosis not present

## 2021-12-15 LAB — BASIC METABOLIC PANEL
BUN: 15 mg/dL (ref 6–23)
CO2: 26 mEq/L (ref 19–32)
Calcium: 8.6 mg/dL (ref 8.4–10.5)
Chloride: 105 mEq/L (ref 96–112)
Creatinine, Ser: 0.83 mg/dL (ref 0.40–1.50)
GFR: 88.54 mL/min (ref >60.00)
Glucose, Bld: 89 mg/dL (ref 70–99)
Potassium: 4 mEq/L (ref 3.5–5.1)
Sodium: 140 mEq/L (ref 135–145)

## 2021-12-15 LAB — CBC WITH DIFFERENTIAL/PLATELET
Basophils Absolute: 0.1 10*3/uL (ref 0.0–0.1)
Basophils Relative: 1.1 % (ref 0.0–3.0)
Eosinophils Absolute: 0.4 10*3/uL (ref 0.0–0.7)
Eosinophils Relative: 5.7 % — ABNORMAL HIGH (ref 0.0–5.0)
HCT: 42.7 % (ref 39.0–52.0)
Hemoglobin: 14.9 g/dL (ref 13.0–17.0)
Lymphocytes Relative: 18.5 % (ref 12.0–46.0)
Lymphs Abs: 1.2 10*3/uL (ref 0.7–4.0)
MCHC: 34.8 g/dL (ref 30.0–36.0)
MCV: 96.2 fl (ref 78.0–100.0)
Monocytes Absolute: 0.5 10*3/uL (ref 0.1–1.0)
Monocytes Relative: 8.6 % (ref 3.0–12.0)
Neutro Abs: 4.1 10*3/uL (ref 1.4–7.7)
Neutrophils Relative %: 66.1 % (ref 43.0–77.0)
Platelets: 194 10*3/uL (ref 150.0–400.0)
RBC: 4.44 Mil/uL (ref 4.22–5.81)
RDW: 13.1 % (ref 11.5–15.5)
WBC: 6.3 10*3/uL (ref 4.0–10.5)

## 2021-12-15 LAB — LIPID PANEL
Cholesterol: 187 mg/dL (ref 0–200)
HDL: 55 mg/dL (ref >39.00)
LDL Cholesterol: 119 mg/dL — ABNORMAL HIGH (ref 0–99)
NonHDL: 132.07
Total CHOL/HDL Ratio: 3
Triglycerides: 63 mg/dL (ref 0.0–149.0)
VLDL: 12.6 mg/dL (ref 0.0–40.0)

## 2021-12-15 LAB — HEPATIC FUNCTION PANEL
ALT: 14 U/L (ref 0–53)
AST: 19 U/L (ref 0–37)
Albumin: 3.8 g/dL (ref 3.5–5.2)
Alkaline Phosphatase: 64 U/L (ref 39–117)
Bilirubin, Direct: 0.2 mg/dL (ref 0.0–0.3)
Total Bilirubin: 0.9 mg/dL (ref 0.2–1.2)
Total Protein: 6.3 g/dL (ref 6.0–8.3)

## 2021-12-15 LAB — PSA, MEDICARE: PSA: 1.11 ng/ml (ref 0.10–4.00)

## 2021-12-22 ENCOUNTER — Ambulatory Visit (INDEPENDENT_AMBULATORY_CARE_PROVIDER_SITE_OTHER): Payer: Medicare HMO | Admitting: Family Medicine

## 2021-12-22 ENCOUNTER — Encounter: Payer: Self-pay | Admitting: Family Medicine

## 2021-12-22 VITALS — BP 112/72 | HR 76 | Temp 97.6°F | Ht 63.5 in | Wt 130.0 lb

## 2021-12-22 DIAGNOSIS — Z0000 Encounter for general adult medical examination without abnormal findings: Secondary | ICD-10-CM | POA: Diagnosis not present

## 2021-12-22 NOTE — Progress Notes (Signed)
Emit Kuenzel T. Ihsan Nomura, MD, CAQ Sports Medicine [facility] at [facility] [address] [place], [address]  Phone: [phone]  FAX: [phone]  Danny Cameron - 71 y.o. male  MRN [id number]  Date of Birth: [birth year]  Date: 12/22/2021  PCP: Hannah Beat, MD  Referral: Hannah Beat, MD  Chief Complaint  Patient presents with   Medicare Wellness   Patient Care Team: Hannah Beat, MD as PCP - General (Family Medicine) Subjective:   Danny Cameron is a 71 y.o. pleasant patient who presents for a medicare wellness examination:  Preventative Health Maintenance Visit:  Health Maintenance Summary Reviewed and updated, unless pt declines services.  Tobacco History Reviewed. Alcohol: No concerns, no excessive use Exercise Habits: 2200 yards swimming, 4 days a week - walks about 6 miles at work.  About 10 miles each day.  STD concerns: no risk or activity to increase risk Drug Use: None  Shingrix - UTD Covid booster - will maybe get? Flu - already had one  Still working out all the time, eating really well.  Health Maintenance  Topic Date Due   COVID-19 Vaccine (6 - Pfizer series) 05/27/2021   INFLUENZA VACCINE  10/28/2021   Zoster Vaccines- Shingrix (2 of 2) 11/09/2021   Hepatitis C Screening  09/14/2048 (Originally 03/22/1969)   TETANUS/TDAP  12/21/2027   COLONOSCOPY (Pts 45-41yrs Insurance coverage will need to be confirmed)  05/13/2030   Pneumonia Vaccine 26+ Years old  Completed   HPV VACCINES  Aged Out    Immunization History  Administered Date(s) Administered   Fluad Quad(high Dose 65+) 11/29/2018, 12/11/2019   Influenza, High Dose Seasonal PF 01/04/2017, 12/10/2020   Influenza,inj,Quad PF,6+ Mos 12/08/2017   PFIZER(Purple Top)SARS-COV-2 Vaccination 04/15/2019, 05/06/2019, 01/12/2020, 07/26/2020   Pfizer Covid-19 Vaccine Bivalent Booster 1yrs & up 01/27/2021   Pneumococcal Conjugate-13 04/20/2016   Pneumococcal  Polysaccharide-23 05/26/2017   Tdap 12/20/2017   Zoster Recombinat (Shingrix) 09/14/2021   Zoster, Live 11/17/2011    Patient Active Problem List   Diagnosis Date Noted   Post-polio syndrome, no weakness 03/07/2013    Past Medical History:  Diagnosis Date   Hepatitis A    years ago   History of post-polio syndrome 1975   No residual weakness    Past Surgical History:  Procedure Laterality Date   COLONOSCOPY  05/2011   COLONOSCOPY WITH PROPOFOL N/A 05/13/2020   Procedure: COLONOSCOPY WITH PROPOFOL;  Surgeon: Toledo, Boykin Nearing, MD;  Location: [facility] ENDOSCOPY;  Service: Gastroenterology;  Laterality: N/A;    Family History  Problem Relation Age of Onset   Heart failure Father     Social History   Social History Narrative   Active cyclist, swimmer   Exercises 12 hours a week. Swims 1 mile at a time, 1 hour twice a week    Past Medical History, Surgical History, Social History, Family History, Problem List, Medications, and Allergies have been reviewed and updated if relevant.  Review of Systems: Pertinent positives are listed above.  Otherwise, a full 14 point review of systems has been done in full and it is negative except where it is noted positive.  Objective:   BP 112/72   Pulse 76   Temp 97.6 F (36.4 C) (Oral)   Ht 5' 3.5" (1.613 m)   Wt 130 lb (59 kg)   SpO2 96%   BMI 22.67 kg/m     01/09/2019    9:21 AM 12/11/2019    9:12 AM 05/13/2020    1:56  PM 12/16/2020    9:05 AM 12/22/2021   10:45 AM  Fall Risk  Falls in the past year? 0 0  0 0  Patient Fall Risk Level   Moderate fall risk     Ideal Body Weight:  Weight in (lb) to have BMI = 25: 143.1 Hearing Screening  Method: Audiometry   500Hz  1000Hz  2000Hz  4000Hz   Right ear 20 20 20 20   Left ear 25 25 25 20   Vision Screening - Comments:: Eye Exam with Dr. Gloriann Loan 10/2021    12/22/2021   10:45 AM 12/16/2020    9:05 AM 12/11/2019    9:12 AM 01/09/2019    9:21 AM 09/14/2017    8:34 AM  Depression screen PHQ  2/9  Decreased Interest 0 0 0 0 0  Down, Depressed, Hopeless 0 0 0 0 0  PHQ - 2 Score 0 0 0 0 0  Altered sleeping     0  Tired, decreased energy     0  Change in appetite     0  Feeling bad or failure about yourself      0  Trouble concentrating     0  Moving slowly or fidgety/restless     0  Suicidal thoughts     0  PHQ-9 Score     0  Difficult doing work/chores     Not difficult at all     GEN: well developed, well nourished, no acute distress Eyes: conjunctiva and lids normal, PERRLA, EOMI ENT: TM clear, nares clear, oral exam WNL Neck: supple, no lymphadenopathy, no thyromegaly, no JVD Pulm: clear to auscultation and percussion, respiratory effort normal CV: regular rate and rhythm, S1-S2, no murmur, rub or gallop, no bruits, peripheral pulses normal and symmetric, no cyanosis, clubbing, edema or varicosities GI: soft, non-tender; no hepatosplenomegaly, masses; active bowel sounds all quadrants GU: deferred Lymph: no cervical, axillary or inguinal adenopathy MSK: gait normal, muscle tone and strength WNL, no joint swelling, effusions, discoloration, crepitus  SKIN: clear, good turgor, color WNL, no rashes, lesions, or ulcerations Neuro: normal mental status, normal strength, sensation, and motion Psych: alert; oriented to person, place and time, normally interactive and not anxious or depressed in appearance.  All labs reviewed with patient.  Results for orders placed or performed in visit on 12/15/21  Lipid panel  Result Value Ref Range   Cholesterol 187 0 - 200 mg/dL   Triglycerides 63.0 0.0 - 149.0 mg/dL   HDL 55.00 >39.00 mg/dL   VLDL 12.6 0.0 - 40.0 mg/dL   LDL Cholesterol 119 (H) 0 - 99 mg/dL   Total CHOL/HDL Ratio 3    NonHDL 132.07   Hepatic function panel  Result Value Ref Range   Total Bilirubin 0.9 0.2 - 1.2 mg/dL   Bilirubin, Direct 0.2 0.0 - 0.3 mg/dL   Alkaline Phosphatase 64 39 - 117 U/L   AST 19 0 - 37 U/L   ALT 14 0 - 53 U/L   Total Protein 6.3  6.0 - 8.3 g/dL   Albumin 3.8 3.5 - 5.2 g/dL  Basic metabolic panel  Result Value Ref Range   Sodium 140 135 - 145 mEq/L   Potassium 4.0 3.5 - 5.1 mEq/L   Chloride 105 96 - 112 mEq/L   CO2 26 19 - 32 mEq/L   Glucose, Bld 89 70 - 99 mg/dL   BUN 15 6 - 23 mg/dL   Creatinine, Ser 0.83 0.40 - 1.50 mg/dL   GFR 88.54 >60.00 mL/min  Calcium 8.6 8.4 - 10.5 mg/dL  CBC with Differential/Platelet  Result Value Ref Range   WBC 6.3 4.0 - 10.5 K/uL   RBC 4.44 4.22 - 5.81 Mil/uL   Hemoglobin 14.9 13.0 - 17.0 g/dL   HCT 32.9 92.4 - 26.8 %   MCV 96.2 78.0 - 100.0 fl   MCHC 34.8 30.0 - 36.0 g/dL   RDW 34.1 96.2 - 22.9 %   Platelets 194.0 150.0 - 400.0 K/uL   Neutrophils Relative % 66.1 43.0 - 77.0 %   Lymphocytes Relative 18.5 12.0 - 46.0 %   Monocytes Relative 8.6 3.0 - 12.0 %   Eosinophils Relative 5.7 (H) 0.0 - 5.0 %   Basophils Relative 1.1 0.0 - 3.0 %   Neutro Abs 4.1 1.4 - 7.7 K/uL   Lymphs Abs 1.2 0.7 - 4.0 K/uL   Monocytes Absolute 0.5 0.1 - 1.0 K/uL   Eosinophils Absolute 0.4 0.0 - 0.7 K/uL   Basophils Absolute 0.1 0.0 - 0.1 K/uL  PSA, Medicare  Result Value Ref Range   PSA 1.11 0.10 - 4.00 ng/ml    Assessment and Plan:     ICD-10-CM   1. Healthcare maintenance  Z00.00      Other than thinking about getting the newest Covid booster, I have no other recommendations.   He continues to do well, and I applauded his effort and fitness level.   Health Maintenance Exam: The patient's preventative maintenance and recommended screening tests for an annual wellness exam were reviewed in full today. Brought up to date unless services declined.  Counselled on the importance of diet, exercise, and its role in overall health and mortality. The patient's FH and SH was reviewed, including their home life, tobacco status, and drug and alcohol status.  Follow-up in 1 year for physical exam or additional follow-up below.  I have personally reviewed the Medicare Annual Wellness  questionnaire and have noted 1. The patient's medical and social history 2. Their use of alcohol, tobacco or illicit drugs 3. Their current medications and supplements 4. The patient's functional ability including ADL's, fall risks, home safety risks and hearing or visual             impairment. 5. Diet and physical activities 6. Evidence for depression or mood disorders 7. Reviewed Updated provider list, see scanned forms and [facility] Snapshot.  8. Reviewed whether or not the patient has HCPOA or living will, and discussed what this means with the patient.  Recommended he bring in a copy for his chart in [facility].  The patients weight, height, BMI and visual acuity have been recorded in the chart I have made referrals, counseling and provided education to the patient based review of the above and I have provided the pt with a written personalized care plan for preventive services.  I have provided the patient with a copy of your personalized plan for preventive services. Instructed to take the time to review along with their updated medication list.  Disposition: No follow-ups on file.  No future appointments.   No orders of the defined types were placed in this encounter.  There are no discontinued medications. No orders of the defined types were placed in this encounter.   Signed,  Elpidio Galea. Diandre Merica, MD   Allergies as of 12/22/2021   No Known Allergies      Medication List        Accurate as of December 22, 2021 10:54 AM. If you have any questions, ask your nurse  or doctor.          CALCIUM 600 PO Take 1 tablet by mouth daily.   MULTIVITAMIN PO Take 1 tablet by mouth daily.   PRESERVISION AREDS 2 PO Take 1 tablet by mouth daily.

## 2022-03-04 ENCOUNTER — Telehealth: Payer: Self-pay

## 2022-03-04 NOTE — Telephone Encounter (Signed)
Pt was triaged by Access Nurse and home care was suggested.  Routed to Dr. Patsy Lager as an Lorain Childes.    [facility] - Client TELEPHONE ADVICE RECORD AccessNurse Patient Name: Danny Cameron RVASALES Gender: Male DOB: February 10, 1951 Age: 71 Y 11 M 12 D Return Phone Number: [phone] (Primary), [phone] (Secondary) Address: City/ State/ Zip: [place] [address] Client [facility] - Client Client Site  [facility] - Day Provider Copland, Karleen Hampshire - MD Contact Type Call Who Is Calling Patient / Member / Family / Caregiver Call Type Triage / Clinical Relationship To Patient Self Return Phone Number [phone] (Primary) Chief Complaint Nasal Congestion Reason for Call Symptomatic / Request for Health Information Initial Comment Caller states he has chest congestion (Call transferred from [facility]) Translation No Nurse Assessment Nurse: Lily Kocher, RN, Adriana Date/Time (Eastern Time): 03/04/2022 8:59:24 AM Confirm and document reason for call. If symptomatic, describe symptoms. ---pt reports he woke up on monday with mucus in his chest. has coughed it up. reports loss of appetite, head pain, fever on monday of 101.8. Does the patient have any new or worsening symptoms? ---Yes Will a triage be completed? ---Yes Related visit to physician within the last 2 weeks? ---No Does the PT have any chronic conditions? (i.e. diabetes, asthma, this includes High risk factors for pregnancy, etc.) ---No Is this a behavioral health or substance abuse call? ---No Guidelines Guideline Title Affirmed Question Affirmed Notes Nurse Date/Time Lamount Cohen Time) Cough - Acute Productive Cough with cold symptoms (e.g., runny nose, postnasal drip, throat clearing) Lily Kocher, RN, Adriana 03/04/2022 9:03:34 AM Disp. Time Lamount Cohen Time) Disposition Final User 03/04/2022 9:14:02 AM Home Care Yes Lily Kocher RN, Ricki Rodriguez Final  Disposition 03/04/2022 9:14:02 AM Home Care Yes Lily Kocher, RN, Adriana PLEASE NOTE: All timestamps contained within this report are represented as [place] Standard Time. CONFIDENTIALTY NOTICE: This fax transmission is intended only for the addressee. It contains information that is legally privileged, confidential or otherwise protected from use or disclosure. If you are not the intended recipient, you are strictly prohibited from reviewing, disclosing, copying using or disseminating any of this information or taking any action in reliance on or regarding this information. If you have received this fax in error, please notify us immediately by telephone so that we can arrange for its return to [place]. Phone: [phone], Toll-Free: [phone], Fax: [phone] Page: 2 of 2 Call Id: [id number] Caller Disagree/Comply Comply Caller Understands Yes PreDisposition Call Doctor Care Advice Given Per Guideline HOME CARE: * You should be able to treat this at home. REASSURANCE AND EDUCATION - COUGH WITH COMMON COLD SYMPTOMS: * It sounds like an uncomplicated cold that we can treat at home. COUGH MEDICINES: * COUGH DROPS: Over-the-counter cough drops can help a lot, especially for mild coughs. They soothe an irritated throat and remove the tickle sensation in the back of the throat. Cough drops are easy to carry with you. * HOME REMEDY - HARD CANDY: Hard candy works just as well as over-the-counter cough drops. People who have diabetes should use sugar-free candy. * HOME REMEDY - HONEY: This old home remedy has been shown to help decrease coughing at night. The adult dosage is 2 teaspoons (10 ml) at bedtime. FOR A RUNNY NOSE - BLOW YOUR NOSE: * Nasal mucus and discharge help wash viruses and bacteria out of the nose and sinuses. * Blowing your nose helps clean out your nose. Use a handkerchief or a paper tissue. NASAL WASHES FOR  A STUFFY NOSE: * Methods: There are several ways to irrigate the nose. You can use a  saline nasal spray bottle (available overthe-counter), a rubber ear syringe, a medical syringe without the needle, or a NETI POT. NASAL DECONGESTANTS FOR A VERY STUFFY NOSE: * Oxymetazoline Nasal Drops (Afrin in [place]; Drixoral in [place]): Available over-the-counter. Clean out the nose before using. Spray each nostril once, wait one minute for absorption, and then spray a second time. * Phenylephrine Nasal Drops (Neo-Synephrine): Available over-the-counter. Clean out the nose before using. Spray each nostril once, wait one minute for absorption, and then spray a second time. * Cover your nose and mouth with a tissue when you sneeze or cough. Wash your hands frequently. * The cold virus is present in your nasal secretions. CONTAGIOUSNESS: CALL BACK IF: * Nasal discharge lasts over 10 days * Earache or facial pain develops * You become worse CARE ADVICE given per Cough - Acute Productive (Adult) guideline

## 2022-04-13 DIAGNOSIS — R69 Illness, unspecified: Secondary | ICD-10-CM | POA: Diagnosis not present

## 2022-04-27 DIAGNOSIS — R69 Illness, unspecified: Secondary | ICD-10-CM | POA: Diagnosis not present

## 2022-05-09 DIAGNOSIS — R69 Illness, unspecified: Secondary | ICD-10-CM | POA: Diagnosis not present

## 2022-05-11 DIAGNOSIS — H353131 Nonexudative age-related macular degeneration, bilateral, early dry stage: Secondary | ICD-10-CM | POA: Diagnosis not present

## 2022-05-11 DIAGNOSIS — H524 Presbyopia: Secondary | ICD-10-CM | POA: Diagnosis not present

## 2022-05-11 DIAGNOSIS — Z0100 Encounter for examination of eyes and vision without abnormal findings: Secondary | ICD-10-CM | POA: Diagnosis not present

## 2022-05-12 DIAGNOSIS — R69 Illness, unspecified: Secondary | ICD-10-CM | POA: Diagnosis not present

## 2022-05-31 DIAGNOSIS — R69 Illness, unspecified: Secondary | ICD-10-CM | POA: Diagnosis not present

## 2022-06-16 DIAGNOSIS — R69 Illness, unspecified: Secondary | ICD-10-CM | POA: Diagnosis not present

## 2022-06-25 DIAGNOSIS — R69 Illness, unspecified: Secondary | ICD-10-CM | POA: Diagnosis not present

## 2022-07-06 DIAGNOSIS — R69 Illness, unspecified: Secondary | ICD-10-CM | POA: Diagnosis not present

## 2022-07-12 NOTE — Progress Notes (Unsigned)
    Mallika Sanmiguel T. Geoffrey Mankin, MD, CAQ Sports Medicine [facility] [address] [place], [address]  Phone: [phone]  FAX: [phone]  Danny Cameron - 72 y.o. male  MRN [id number]  Date of Birth: [birth year]  Date: 07/13/2022  PCP: Hannah Beat, MD  Referral: Hannah Beat, MD  No chief complaint on file.  Subjective:   Danny Cameron is a 72 y.o. very pleasant male patient with There is no height or weight on file to calculate BMI. who presents with the following:  Patient presents with some ongoing heartburn.    Review of Systems is noted in the HPI, as appropriate  Objective:   There were no vitals taken for this visit.  GEN: No acute distress; alert,appropriate. PULM: Breathing comfortably in no respiratory distress PSYCH: Normally interactive.   Laboratory and Imaging Data:  Assessment and Plan:   ***

## 2022-07-13 ENCOUNTER — Ambulatory Visit (INDEPENDENT_AMBULATORY_CARE_PROVIDER_SITE_OTHER): Payer: Medicare HMO | Admitting: Family Medicine

## 2022-07-13 ENCOUNTER — Encounter: Payer: Self-pay | Admitting: Family Medicine

## 2022-07-13 VITALS — BP 130/84 | HR 96 | Temp 97.2°F | Ht 63.5 in | Wt 132.4 lb

## 2022-07-13 DIAGNOSIS — R69 Illness, unspecified: Secondary | ICD-10-CM | POA: Diagnosis not present

## 2022-07-13 DIAGNOSIS — K219 Gastro-esophageal reflux disease without esophagitis: Secondary | ICD-10-CM

## 2022-07-13 NOTE — Patient Instructions (Signed)
Try Pepcid AC twice a day for the next 2 weeks  If that does not work, then try Omeprazole 20 mg, once in the morning

## 2022-07-27 DIAGNOSIS — R69 Illness, unspecified: Secondary | ICD-10-CM | POA: Diagnosis not present

## 2022-07-28 DIAGNOSIS — R69 Illness, unspecified: Secondary | ICD-10-CM | POA: Diagnosis not present

## 2022-08-04 DIAGNOSIS — R69 Illness, unspecified: Secondary | ICD-10-CM | POA: Diagnosis not present

## 2022-08-17 DIAGNOSIS — R69 Illness, unspecified: Secondary | ICD-10-CM | POA: Diagnosis not present

## 2022-08-24 DIAGNOSIS — R69 Illness, unspecified: Secondary | ICD-10-CM | POA: Diagnosis not present

## 2022-08-31 DIAGNOSIS — R69 Illness, unspecified: Secondary | ICD-10-CM | POA: Diagnosis not present

## 2022-09-22 DIAGNOSIS — R69 Illness, unspecified: Secondary | ICD-10-CM | POA: Diagnosis not present

## 2022-09-28 DIAGNOSIS — R69 Illness, unspecified: Secondary | ICD-10-CM | POA: Diagnosis not present

## 2022-09-29 ENCOUNTER — Ambulatory Visit (INDEPENDENT_AMBULATORY_CARE_PROVIDER_SITE_OTHER): Payer: Medicare HMO

## 2022-09-29 VITALS — Ht 63.5 in | Wt 132.0 lb

## 2022-09-29 DIAGNOSIS — Z0000 Encounter for general adult medical examination without abnormal findings: Secondary | ICD-10-CM

## 2022-09-29 NOTE — Patient Instructions (Addendum)
Danny Cameron , Thank you for taking time to come for your Medicare Wellness Visit. I appreciate your ongoing commitment to your health goals. Please review the following plan we discussed and let me know if I can assist you in the future.   These are the goals we discussed:  Goals      Increase physical activity     Starting 09/14/2017, I will continue to exercise for at least 11 hours weekly.      Remain active and independent        This is a list of the screening recommended for you and due dates:  Health Maintenance  Topic Date Due   COVID-19 Vaccine (6 - 2023-24 season) 11/28/2021   Hepatitis C Screening  09/14/2048*   Flu Shot  10/29/2022   Medicare Annual Wellness Visit  09/29/2023   DTaP/Tdap/Td vaccine (2 - Td or Tdap) 12/21/2027   Colon Cancer Screening  05/13/2030   Pneumonia Vaccine  Completed   Zoster (Shingles) Vaccine  Completed   HPV Vaccine  Aged Out  *Topic was postponed. The date shown is not the original due date.    Advanced directives: We have a copy of your advanced directives available in your record should your provider ever need to access them.  Conditions/risks identified: Aim for 30 minutes of exercise or brisk walking, 6-8 glasses of water, and 5 servings of fruits and vegetables each day.  Next appointment: Follow up in one year for your annual wellness visit.   Preventive Care 57 Years and Older, Male  Preventive care refers to lifestyle choices and visits with your health care provider that can promote health and wellness. What does preventive care include? A yearly physical exam. This is also called an annual well check. Dental exams once or twice a year. Routine eye exams. Ask your health care provider how often you should have your eyes checked. Personal lifestyle choices, including: Daily care of your teeth and gums. Regular physical activity. Eating a healthy diet. Avoiding tobacco and drug use. Limiting alcohol use. Practicing safe  sex. Taking low doses of aspirin every day. Taking vitamin and mineral supplements as recommended by your health care provider. What happens during an annual well check? The services and screenings done by your health care provider during your annual well check will depend on your age, overall health, lifestyle risk factors, and family history of disease. Counseling  Your health care provider may ask you questions about your: Alcohol use. Tobacco use. Drug use. Emotional well-being. Home and relationship well-being. Sexual activity. Eating habits. History of falls. Memory and ability to understand (cognition). Work and work Astronomer. Screening  You may have the following tests or measurements: Height, weight, and BMI. Blood pressure. Lipid and cholesterol levels. These may be checked every 5 years, or more frequently if you are over 19 years old. Skin check. Lung cancer screening. You may have this screening every year starting at age 67 if you have a 30-pack-year history of smoking and currently smoke or have quit within the past 15 years. Fecal occult blood test (FOBT) of the stool. You may have this test every year starting at age 26. Flexible sigmoidoscopy or colonoscopy. You may have a sigmoidoscopy every 5 years or a colonoscopy every 10 years starting at age 28. Prostate cancer screening. Recommendations will vary depending on your family history and other risks. Hepatitis C blood test. Hepatitis B blood test. Sexually transmitted disease (STD) testing. Diabetes screening. This is done by checking  your blood sugar (glucose) after you have not eaten for a while (fasting). You may have this done every 1-3 years. Abdominal aortic aneurysm (AAA) screening. You may need this if you are a current or former smoker. Osteoporosis. You may be screened starting at age 53 if you are at high risk. Talk with your health care provider about your test results, treatment options, and if  necessary, the need for more tests. Vaccines  Your health care provider may recommend certain vaccines, such as: Influenza vaccine. This is recommended every year. Tetanus, diphtheria, and acellular pertussis (Tdap, Td) vaccine. You may need a Td booster every 10 years. Zoster vaccine. You may need this after age 88. Pneumococcal 13-valent conjugate (PCV13) vaccine. One dose is recommended after age 72. Pneumococcal polysaccharide (PPSV23) vaccine. One dose is recommended after age 72. Talk to your health care provider about which screenings and vaccines you need and how often you need them. This information is not intended to replace advice given to you by your health care provider. Make sure you discuss any questions you have with your health care provider. Document Released: 04/12/2015 Document Revised: 12/04/2015 Document Reviewed: 01/15/2015 Elsevier Interactive Patient Education  2017 [employer] Prevention in the Home Falls can cause injuries. They can happen to people of all ages. There are many things you can do to make your home safe and to help prevent falls. What can I do on the outside of my home? Regularly fix the edges of walkways and driveways and fix any cracks. Remove anything that might make you trip as you walk through a door, such as a raised step or threshold. Trim any bushes or trees on the path to your home. Use bright outdoor lighting. Clear any walking paths of anything that might make someone trip, such as rocks or tools. Regularly check to see if handrails are loose or broken. Make sure that both sides of any steps have handrails. Any raised decks and porches should have guardrails on the edges. Have any leaves, snow, or ice cleared regularly. Use sand or salt on walking paths during winter. Clean up any spills in your garage right away. This includes oil or grease spills. What can I do in the bathroom? Use night lights. Install grab bars by the toilet  and in the tub and shower. Do not use towel bars as grab bars. Use non-skid mats or decals in the tub or shower. If you need to sit down in the shower, use a plastic, non-slip stool. Keep the floor dry. Clean up any water that spills on the floor as soon as it happens. Remove soap buildup in the tub or shower regularly. Attach bath mats securely with double-sided non-slip rug tape. Do not have throw rugs and other things on the floor that can make you trip. What can I do in the bedroom? Use night lights. Make sure that you have a light by your bed that is easy to reach. Do not use any sheets or blankets that are too big for your bed. They should not hang down onto the floor. Have a firm chair that has side arms. You can use this for support while you get dressed. Do not have throw rugs and other things on the floor that can make you trip. What can I do in the kitchen? Clean up any spills right away. Avoid walking on wet floors. Keep items that you use a lot in easy-to-reach places. If you need to reach something  above you, use a strong step stool that has a grab bar. Keep electrical cords out of the way. Do not use floor polish or wax that makes floors slippery. If you must use wax, use non-skid floor wax. Do not have throw rugs and other things on the floor that can make you trip. What can I do with my stairs? Do not leave any items on the stairs. Make sure that there are handrails on both sides of the stairs and use them. Fix handrails that are broken or loose. Make sure that handrails are as long as the stairways. Check any carpeting to make sure that it is firmly attached to the stairs. Fix any carpet that is loose or worn. Avoid having throw rugs at the top or bottom of the stairs. If you do have throw rugs, attach them to the floor with carpet tape. Make sure that you have a light switch at the top of the stairs and the bottom of the stairs. If you do not have them, ask someone to add  them for you. What else can I do to help prevent falls? Wear shoes that: Do not have high heels. Have rubber bottoms. Are comfortable and fit you well. Are closed at the toe. Do not wear sandals. If you use a stepladder: Make sure that it is fully opened. Do not climb a closed stepladder. Make sure that both sides of the stepladder are locked into place. Ask someone to hold it for you, if possible. Clearly mark and make sure that you can see: Any grab bars or handrails. First and last steps. Where the edge of each step is. Use tools that help you move around (mobility aids) if they are needed. These include: Canes. Walkers. Scooters. Crutches. Turn on the lights when you go into a dark area. Replace any light bulbs as soon as they burn out. Set up your furniture so you have a clear path. Avoid moving your furniture around. If any of your floors are uneven, fix them. If there are any pets around you, be aware of where they are. Review your medicines with your doctor. Some medicines can make you feel dizzy. This can increase your chance of falling. Ask your doctor what other things that you can do to help prevent falls. This information is not intended to replace advice given to you by your health care provider. Make sure you discuss any questions you have with your health care provider. Document Released: 01/10/2009 Document Revised: 08/22/2015 Document Reviewed: 04/20/2014 Elsevier Interactive Patient Education  2017 [employer].

## 2022-09-29 NOTE — Progress Notes (Signed)
Subjective:   Danny Cameron is a 72 y.o. male who presents for Medicare Annual/Subsequent preventive examination.  Visit Complete: Virtual  I connected with  Danny Cameron on 09/29/22 by a audio enabled telemedicine application and verified that I am speaking with the correct person using two identifiers.  Patient Location: Home  Provider Location: Home Office  I discussed the limitations of evaluation and management by telemedicine. The patient expressed understanding and agreed to proceed.  Review of Systems     Cardiac Risk Factors include: advanced age (>71men, >78 women);male gender     Objective:    Today's Vitals   09/29/22 1122  Weight: 132 lb (59.9 kg)  Height: 5' 3.5" (1.613 m)   Body mass index is 23.02 kg/m.     09/29/2022   12:05 PM 05/13/2020    1:52 PM 09/14/2017    8:34 AM  Advanced Directives  Does Patient Have a Medical Advance Directive? Yes Yes Yes  Type of Estate agent of [place];Living will  Healthcare Power of [place];Living will  Does patient want to make changes to medical advance directive? No - Patient declined    Copy of Healthcare Power of Attorney in Chart? Yes - validated most recent copy scanned in chart (See row information)  Yes  Would patient like information on creating a medical advance directive? No - Patient declined      Current Medications (verified) Outpatient Encounter Medications as of 09/29/2022  Medication Sig   Calcium Carbonate (CALCIUM 600 PO) Take 1 tablet by mouth daily.   Multiple Vitamin (MULTIVITAMIN PO) Take 1 tablet by mouth daily.   Multiple Vitamins-Minerals (PRESERVISION AREDS 2 PO) Take 1 tablet by mouth daily.   No facility-administered encounter medications on file as of 09/29/2022.    Allergies (verified) Patient has no known allergies.   History: Past Medical History:  Diagnosis Date   Hepatitis A    years ago   History of post-polio syndrome 1975   No residual  weakness   Past Surgical History:  Procedure Laterality Date   COLONOSCOPY  05/2011   COLONOSCOPY WITH PROPOFOL N/A 05/13/2020   Procedure: COLONOSCOPY WITH PROPOFOL;  Surgeon: Toledo, Boykin Nearing, MD;  Location: [facility] ENDOSCOPY;  Service: Gastroenterology;  Laterality: N/A;   Family History  Problem Relation Age of Onset   Heart failure Father    Social History   Socioeconomic History   Marital status: Single    Spouse name: Not on file   Number of children: 0   Years of education: Not on file   Highest education level: Not on file  Occupational History   Occupation: Radiation protection practitioner: [employer]  Tobacco Use   Smoking status: Former   Smokeless tobacco: Never  Building services engineer Use: Never used  Substance and Sexual Activity   Alcohol use: Yes    Alcohol/week: 7.0 standard drinks of alcohol    Types: 7 Cans of beer per week    Comment:  one beer a night   Drug use: No   Sexual activity: Not on file  Other Topics Concern   Not on file  Social History Narrative   Active cyclist, swimmer   Exercises 12 hours a week. Swims 1 mile at a time, 1 hour twice a week   Social Determinants of Health   Financial Resource Strain: Low Risk  (09/29/2022)   Overall Financial Resource Strain (CARDIA)    Difficulty of Paying Living Expenses: Not hard  at all  Food Insecurity: No Food Insecurity (09/29/2022)   Hunger Vital Sign    Worried About Running Out of Food in the Last Year: Never true    Ran Out of Food in the Last Year: Never true  Transportation Needs: No Transportation Needs (09/29/2022)   PRAPARE - Administrator, Civil Service (Medical): No    Lack of Transportation (Non-Medical): No  Physical Activity: Sufficiently Active (09/29/2022)   Exercise Vital Sign    Days of Exercise per Week: 7 days    Minutes of Exercise per Session: 30 min  Stress: No Stress Concern Present (09/29/2022)   [employer] of Occupational Health - Occupational Stress  Questionnaire    Feeling of Stress : Not at all  Social Connections: Socially Integrated (09/29/2022)   Social Connection and Isolation Panel [NHANES]    Frequency of Communication with Friends and Family: More than three times a week    Frequency of Social Gatherings with Friends and Family: Three times a week    Attends Religious Services: More than 4 times per year    Active Member of Clubs or Organizations: Yes    Attends Engineer, structural: More than 4 times per year    Marital Status: Married    Tobacco Counseling Counseling given: Not Answered   Clinical Intake:  Pre-visit preparation completed: Yes  Pain : No/denies pain     Diabetes: No  How often do you need to have someone help you when you read instructions, pamphlets, or other written materials from your doctor or pharmacy?: 1 - Never  Interpreter Needed?: No  Information entered by :: Danny Fantasia LPN   Activities of Daily Living    09/29/2022   12:00 PM  In your present state of health, do you have any difficulty performing the following activities:  Hearing? 0  Vision? 0  Difficulty concentrating or making decisions? 0  Walking or climbing stairs? 0  Dressing or bathing? 0  Doing errands, shopping? 0  Preparing Food and eating ? N  Using the Toilet? N  In the past six months, have you accidently leaked urine? N  Do you have problems with loss of bowel control? N  Managing your Medications? N  Managing your Finances? N  Housekeeping or managing your Housekeeping? N    Patient Care Team: Danny Beat, MD as PCP - General (Family Medicine)  Indicate any recent Medical Services you may have received from other than [facility] providers in the past year (date may be approximate).     Assessment:   This is a routine wellness examination for Danny Cameron.  Hearing/Vision screen Hearing Screening - Comments:: Denies hearing difficulties   Vision Screening - Comments:: Wears rx glasses - up to  date with routine eye exams with Danny Cameron    Dietary issues and exercise activities discussed:     Goals Addressed             This Visit's Progress    Remain active and independent         Depression Screen    09/29/2022   12:02 PM 12/22/2021   10:45 AM 12/16/2020    9:05 AM 12/11/2019    9:12 AM 01/09/2019    9:21 AM 09/14/2017    8:34 AM 04/20/2016    2:58 PM  PHQ 2/9 Scores  PHQ - 2 Score 0 0 0 0 0 0 0  PHQ- 9 Score      0  Fall Risk    09/29/2022   12:06 PM 07/13/2022   11:23 AM 12/22/2021   10:45 AM 12/16/2020    9:05 AM 12/11/2019    9:12 AM  Fall Risk   Falls in the past year? 0 0 0 0 0  Number falls in past yr: 0 0     Injury with Fall? 0 0     Risk for fall due to : No Fall Risks No Fall Risks     Follow up Falls prevention discussed;Education provided;Falls evaluation completed Falls evaluation completed       MEDICARE RISK AT HOME:  Medicare Risk at Home - 09/29/22 1206     Any stairs in or around the home? Yes    If so, are there any without handrails? No    Home free of loose throw rugs in walkways, pet beds, electrical cords, etc? Yes    Adequate lighting in your home to reduce risk of falls? Yes    Life alert? No    Use of a cane, walker or w/c? No    Grab bars in the bathroom? Yes    Shower chair or bench in shower? No    Elevated toilet seat or a handicapped toilet? Yes             TIMED UP AND GO:  Was the test performed?  No    Cognitive Function:    09/14/2017    8:28 AM  MMSE - Mini Mental State Exam  Orientation to time 5  Orientation to Place 5  Registration 3  Attention/ Calculation 0  Recall 3  Language- name 2 objects 0  Language- repeat 1  Language- follow 3 step command 3  Language- read & follow direction 0  Write a sentence 0  Copy design 0  Total score 20        09/29/2022   12:07 PM  6CIT Screen  What Year? 0 points  What month? 0 points  What time? 0 points  Count back from 20 0 points   Months in reverse 0 points  Repeat phrase 0 points  Total Score 0 points    Immunizations Immunization History  Administered Date(s) Administered   Fluad Quad(high Dose 65+) 11/29/2018, 12/11/2019, 12/06/2021   Influenza, High Dose Seasonal PF 01/04/2017, 12/10/2020   Influenza,inj,Quad PF,6+ Mos 12/08/2017   PFIZER(Purple Top)SARS-COV-2 Vaccination 04/15/2019, 05/06/2019, 01/12/2020, 07/26/2020   Pfizer Covid-19 Vaccine Bivalent Booster 73yrs & up 01/27/2021   Pneumococcal Conjugate-13 04/20/2016   Pneumococcal Polysaccharide-23 05/26/2017   Tdap 12/20/2017   Zoster Recombinant(Shingrix) 07/12/2021, 09/14/2021   Zoster, Live 11/17/2011    TDAP status: Up to date  Pneumococcal vaccine status: Up to date  Covid-19 vaccine status: Information provided on how to obtain vaccines.   Qualifies for Shingles Vaccine? Yes   Zostavax completed Yes   Shingrix Completed?: Yes  Screening Tests Health Maintenance  Topic Date Due   COVID-19 Vaccine (6 - 2023-24 season) 11/28/2021   Hepatitis C Screening  09/14/2048 (Originally 03/22/1969)   INFLUENZA VACCINE  10/29/2022   Medicare Annual Wellness (AWV)  09/29/2023   DTaP/Tdap/Td (2 - Td or Tdap) 12/21/2027   Colonoscopy  05/13/2030   Pneumonia Vaccine 64+ Years old  Completed   Zoster Vaccines- Shingrix  Completed   HPV VACCINES  Aged Out    Health Maintenance  Health Maintenance Due  Topic Date Due   COVID-19 Vaccine (6 - 2023-24 season) 11/28/2021    Colorectal cancer screening: Type of screening:  Colonoscopy. Completed 05/13/20. Repeat every 10 years  Lung Cancer Screening: (Low Dose CT Chest recommended if Age 62-80 years, 20 pack-year currently smoking OR have quit w/in 15years.) does not qualify.   Lung Cancer Screening Referral: n/a  Additional Screening:  Hepatitis C Screening: does qualify;   Vision Screening: Recommended annual ophthalmology exams for early detection of glaucoma and other disorders of the  eye. Is the patient up to date with their annual eye exam?  Yes  Who is the provider or what is the name of the office in which the patient attends annual eye exams? Dr. Alvester Morin If pt is not established with a provider, would they like to be referred to a provider to establish care? No .   Dental Screening: Recommended annual dental exams for proper oral hygiene  Community Resource Referral / Chronic Care Management: CRR required this visit?  No   CCM required this visit?  No     Plan:     I have personally reviewed and noted the following in the patient's chart:   Medical and social history Use of alcohol, tobacco or illicit drugs  Current medications and supplements including opioid prescriptions. Patient is currently taking opioid prescriptions. Information provided to patient regarding non-opioid alternatives. Patient advised to discuss non-opioid treatment plan with their provider. Functional ability and status Nutritional status Physical activity Advanced directives List of other physicians Hospitalizations, surgeries, and ER visits in previous 12 months Vitals Screenings to include cognitive, depression, and falls Referrals and appointments  In addition, I have reviewed and discussed with patient certain preventive protocols, quality metrics, and best practice recommendations. A written personalized care plan for preventive services as well as general preventive health recommendations were provided to patient.     Danny Cameron [place], [place]   04/04/1094   After Visit Summary: (Mail) Due to this being a telephonic visit, the after visit summary with patients personalized plan was offered to patient via mail   Nurse Notes: No concerns

## 2022-10-15 DIAGNOSIS — R69 Illness, unspecified: Secondary | ICD-10-CM | POA: Diagnosis not present

## 2022-10-26 DIAGNOSIS — R69 Illness, unspecified: Secondary | ICD-10-CM | POA: Diagnosis not present

## 2022-10-27 ENCOUNTER — Other Ambulatory Visit: Payer: Self-pay | Admitting: Family Medicine

## 2022-10-27 DIAGNOSIS — Z131 Encounter for screening for diabetes mellitus: Secondary | ICD-10-CM

## 2022-10-27 DIAGNOSIS — Z125 Encounter for screening for malignant neoplasm of prostate: Secondary | ICD-10-CM

## 2022-10-27 DIAGNOSIS — Z13220 Encounter for screening for lipoid disorders: Secondary | ICD-10-CM

## 2022-10-27 DIAGNOSIS — Z0000 Encounter for general adult medical examination without abnormal findings: Secondary | ICD-10-CM

## 2022-10-28 NOTE — Addendum Note (Signed)
Addended by: Alvina Chou on: 10/28/2022 08:08 AM   Modules accepted: Orders

## 2022-11-04 ENCOUNTER — Other Ambulatory Visit (INDEPENDENT_AMBULATORY_CARE_PROVIDER_SITE_OTHER): Payer: Medicare HMO

## 2022-11-04 DIAGNOSIS — Z131 Encounter for screening for diabetes mellitus: Secondary | ICD-10-CM

## 2022-11-04 DIAGNOSIS — Z13220 Encounter for screening for lipoid disorders: Secondary | ICD-10-CM

## 2022-11-04 DIAGNOSIS — Z125 Encounter for screening for malignant neoplasm of prostate: Secondary | ICD-10-CM

## 2022-11-04 DIAGNOSIS — Z0000 Encounter for general adult medical examination without abnormal findings: Secondary | ICD-10-CM

## 2022-11-04 LAB — CBC WITH DIFFERENTIAL/PLATELET
Basophils Absolute: 0.1 10*3/uL (ref 0.0–0.1)
Basophils Relative: 1.2 % (ref 0.0–3.0)
Eosinophils Absolute: 0.5 10*3/uL (ref 0.0–0.7)
Eosinophils Relative: 7.9 % — ABNORMAL HIGH (ref 0.0–5.0)
HCT: 45.1 % (ref 39.0–52.0)
Hemoglobin: 15.1 g/dL (ref 13.0–17.0)
Lymphocytes Relative: 19.8 % (ref 12.0–46.0)
Lymphs Abs: 1.3 10*3/uL (ref 0.7–4.0)
MCHC: 33.5 g/dL (ref 30.0–36.0)
MCV: 96.7 fl (ref 78.0–100.0)
Monocytes Absolute: 0.6 10*3/uL (ref 0.1–1.0)
Monocytes Relative: 8.2 % (ref 3.0–12.0)
Neutro Abs: 4.2 10*3/uL (ref 1.4–7.7)
Neutrophils Relative %: 62.9 % (ref 43.0–77.0)
Platelets: 211 10*3/uL (ref 150.0–400.0)
RBC: 4.67 Mil/uL (ref 4.22–5.81)
RDW: 13 % (ref 11.5–15.5)
WBC: 6.8 10*3/uL (ref 4.0–10.5)

## 2022-11-04 LAB — LIPID PANEL
Cholesterol: 191 mg/dL (ref 0–200)
HDL: 56.3 mg/dL (ref >39.00)
LDL Cholesterol: 120 mg/dL — ABNORMAL HIGH (ref 0–99)
NonHDL: 134.24
Total CHOL/HDL Ratio: 3
Triglycerides: 72 mg/dL (ref 0.0–149.0)
VLDL: 14.4 mg/dL (ref 0.0–40.0)

## 2022-11-04 LAB — HEPATIC FUNCTION PANEL
ALT: 16 U/L (ref 0–53)
AST: 21 U/L (ref 0–37)
Albumin: 4.1 g/dL (ref 3.5–5.2)
Alkaline Phosphatase: 62 U/L (ref 39–117)
Bilirubin, Direct: 0.2 mg/dL (ref 0.0–0.3)
Total Bilirubin: 1.2 mg/dL (ref 0.2–1.2)
Total Protein: 6.5 g/dL (ref 6.0–8.3)

## 2022-11-04 LAB — BASIC METABOLIC PANEL
BUN: 14 mg/dL (ref 6–23)
CO2: 29 mEq/L (ref 19–32)
Calcium: 8.8 mg/dL (ref 8.4–10.5)
Chloride: 102 mEq/L (ref 96–112)
Creatinine, Ser: 0.78 mg/dL (ref 0.40–1.50)
GFR: 89.65 mL/min (ref >60.00)
Glucose, Bld: 94 mg/dL (ref 70–99)
Potassium: 4 mEq/L (ref 3.5–5.1)
Sodium: 138 mEq/L (ref 135–145)

## 2022-11-04 LAB — PSA, MEDICARE: PSA: 1.4 ng/ml (ref 0.10–4.00)

## 2022-11-09 DIAGNOSIS — H353131 Nonexudative age-related macular degeneration, bilateral, early dry stage: Secondary | ICD-10-CM | POA: Diagnosis not present

## 2022-11-10 DIAGNOSIS — R69 Illness, unspecified: Secondary | ICD-10-CM | POA: Diagnosis not present

## 2022-11-16 DIAGNOSIS — R69 Illness, unspecified: Secondary | ICD-10-CM | POA: Diagnosis not present

## 2022-11-18 DIAGNOSIS — R69 Illness, unspecified: Secondary | ICD-10-CM | POA: Diagnosis not present

## 2022-12-01 ENCOUNTER — Other Ambulatory Visit: Payer: Medicare HMO

## 2022-12-09 ENCOUNTER — Encounter: Payer: Medicare HMO | Admitting: Family Medicine

## 2022-12-15 DIAGNOSIS — R69 Illness, unspecified: Secondary | ICD-10-CM | POA: Diagnosis not present

## 2022-12-25 DIAGNOSIS — R69 Illness, unspecified: Secondary | ICD-10-CM | POA: Diagnosis not present

## 2022-12-29 DIAGNOSIS — R69 Illness, unspecified: Secondary | ICD-10-CM | POA: Diagnosis not present

## 2022-12-29 NOTE — Progress Notes (Signed)
Danny Cameron T. Danny Robison, MD, CAQ Sports Medicine [facility] at [facility] [address] [place], [address]  Phone: [phone]  FAX: [phone]  Danny Cameron - 72 y.o. male  MRN [id number]  Date of Birth: 12/05/1950  Date: 12/31/2022  PCP: Hannah Beat, MD  Referral: Hannah Beat, MD  Chief Complaint  Patient presents with   Skin Problem    C/o lesion on scalp and on anterior throat. Noticed a couple days ago. Denies any irritation.    Subjective:   Danny Cameron is a 72 y.o. very pleasant male patient with Body mass index is 23.6 kg/m. who presents with the following:  [name] is a very well-known patient, having been known for many years.  He is generally quite healthy.  He presents today with a lesion on his head and neck that he wanted me to evaluate.  He has 2 areas in question.  There is a small raised poorly lesion on his anterior throat.  This did scab over but is been there for a relatively extended amount of time.  He also has a irritated inflamed and elevated brown lesion on the top of his head.  L deltoid tingling after vaccine.  He also reports some lateral deltoid tingling after he received a vaccine.  Strength is normal and sensation is normal.  L neck - ? Bcc  Seb K - L scalp  Immunization History  Administered Date(s) Administered   Fluad Quad(high Dose 65+) 11/29/2018, 12/11/2019, 12/06/2021, 11/08/2022   Influenza, High Dose Seasonal PF 01/04/2017, 12/10/2020   Influenza,inj,Quad PF,6+ Mos 12/08/2017   PFIZER Comirnaty(Gray Top)Covid-19 Tri-Sucrose Vaccine 11/08/2022   PFIZER(Purple Top)SARS-COV-2 Vaccination 04/15/2019, 05/06/2019, 01/12/2020, 07/26/2020   Pfizer Covid-19 Vaccine Bivalent Booster 28yrs & up 01/27/2021   Pneumococcal Conjugate-13 04/20/2016   Pneumococcal Polysaccharide-23 05/26/2017   Tdap 12/20/2017   Zoster Recombinant(Shingrix) 07/12/2021, 09/14/2021   Zoster, Live 11/17/2011      Review of Systems is noted in the HPI, as appropriate  Objective:   BP 124/80   Pulse 70   Temp 97.9 F (36.6 C) (Oral)   Ht 5' 3.5" (1.613 m)   Wt 135 lb 6 oz (61.4 kg)   SpO2 96%   BMI 23.60 kg/m   GEN: No acute distress; alert,appropriate. PULM: Breathing comfortably in no respiratory distress PSYCH: Normally interactive.   On the top of the scalp there is an elevated lesion just larger than a centimeter across that is irritated and brown in appearance.  At the neck there is a lesion that is roughly 3 mm in size that is elevated and when looked under magnification it looks as if it is elevated and pearly in appearance.  Tiny area that looks to be a tiny ulcer.  Left shoulder with full range of motion, strength is entirely normal.  Sensation is normal.  Laboratory and Imaging Data:  Assessment and Plan:     ICD-10-CM   1. Skin lesion of neck  L98.9 Ambulatory referral to Dermatology    2. Seborrheic keratosis of scalp  L82.1     3. Numbness and tingling in left arm  R20.0    R20.2      Small elevated pearly lesion on the anterior neck.  I think this is most concerning for basal cell carcinoma, and I am going to consult with dermatology.  On the scalp he also has an elevated lesion is significant for seborrheic keratoses is irritated.  He would like for me to do cryotherapy  on this today.  He also has some tingling in the lateral deltoid in the region of where he received a vaccination.  This is probably a vaccine driven neuropraxia that will resolve with time.  Cryotherapy  Reason: Inflamed skin lesion/seborrheic keratosis Location: Scalp  Liquid nitrogen was applied using the liquid nitrogen gun without difficulty with an otoscope tip for concentration. Tolerated well without complications.   Medication Management during today's office visit: No orders of the defined types were placed in this encounter.  There are no discontinued medications.  Orders  placed today for conditions managed today: Orders Placed This Encounter  Procedures   Ambulatory referral to Dermatology    Disposition: No follow-ups on file.  Dr[employer] One speech-to-text software was used for transcription in this dictation.  Possible transcriptional errors can occur using Animal nutritionist.   Signed,  Elpidio Galea. Joellen Tullos, MD   Outpatient Encounter Medications as of 12/31/2022  Medication Sig   Calcium Carbonate (CALCIUM 600 PO) Take 1 tablet by mouth daily.   Multiple Vitamin (MULTIVITAMIN PO) Take 1 tablet by mouth daily.   Multiple Vitamins-Minerals (PRESERVISION AREDS 2 PO) Take 1 tablet by mouth daily.   No facility-administered encounter medications on file as of 12/31/2022.

## 2022-12-31 ENCOUNTER — Ambulatory Visit: Payer: Medicare HMO | Admitting: Family Medicine

## 2022-12-31 ENCOUNTER — Encounter: Payer: Self-pay | Admitting: Family Medicine

## 2022-12-31 VITALS — BP 124/80 | HR 70 | Temp 97.9°F | Ht 63.5 in | Wt 135.4 lb

## 2022-12-31 DIAGNOSIS — R202 Paresthesia of skin: Secondary | ICD-10-CM | POA: Diagnosis not present

## 2022-12-31 DIAGNOSIS — R200 Anesthesia of skin: Secondary | ICD-10-CM | POA: Diagnosis not present

## 2022-12-31 DIAGNOSIS — L989 Disorder of the skin and subcutaneous tissue, unspecified: Secondary | ICD-10-CM | POA: Diagnosis not present

## 2022-12-31 DIAGNOSIS — L821 Other seborrheic keratosis: Secondary | ICD-10-CM | POA: Diagnosis not present

## 2023-01-06 ENCOUNTER — Encounter: Payer: Medicare HMO | Admitting: Family Medicine

## 2023-01-13 DIAGNOSIS — R69 Illness, unspecified: Secondary | ICD-10-CM | POA: Diagnosis not present

## 2023-01-21 DIAGNOSIS — R69 Illness, unspecified: Secondary | ICD-10-CM | POA: Diagnosis not present

## 2023-01-31 NOTE — Progress Notes (Unsigned)
[name] T. Shanyce Daris, MD, CAQ Sports Medicine [facility] at [facility] [address] [place], [address]  Phone: [phone]  FAX: [phone]  Danny Cameron - 72 y.o. male  MRN [id number]  Date of Birth: [birth year]  Date: 02/01/2023  PCP: Hannah Beat, MD  Referral: Hannah Beat, MD  No chief complaint on file.  Patient Care Team: Hannah Beat, MD as PCP - General (Family Medicine) Subjective:   Danny Cameron is a 72 y.o. pleasant patient who presents with the following:  Preventative Health Maintenance Visit:  Health Maintenance Summary Reviewed and updated, unless pt declines services.  Tobacco History Reviewed. Alcohol: No concerns, no excessive use Exercise Habits: Some activity, rec at least 30 mins 5 times a week STD concerns: no risk or activity to increase risk Drug Use: None  UTD on all vaccination and screening Except RSV    Health Maintenance  Topic Date Due   COVID-19 Vaccine (7 - 2023-24 season) 01/03/2023   Hepatitis C Screening  09/14/2048 (Originally 03/22/1969)   Medicare Annual Wellness (AWV)  09/29/2023   DTaP/Tdap/Td (2 - Td or Tdap) 12/21/2027   Colonoscopy  05/13/2030   Pneumonia Vaccine 42+ Years old  Completed   INFLUENZA VACCINE  Completed   Zoster Vaccines- Shingrix  Completed   HPV VACCINES  Aged Out   Immunization History  Administered Date(s) Administered   Fluad Quad(high Dose 65+) 11/29/2018, 12/11/2019, 12/06/2021, 11/08/2022   Influenza, High Dose Seasonal PF 01/04/2017, 12/10/2020   Influenza,inj,Quad PF,6+ Mos 12/08/2017   PFIZER Comirnaty(Gray Top)Covid-19 Tri-Sucrose Vaccine 11/08/2022   PFIZER(Purple Top)SARS-COV-2 Vaccination 04/15/2019, 05/06/2019, 01/12/2020, 07/26/2020   Pfizer Covid-19 Vaccine Bivalent Booster 29yrs & up 01/27/2021   Pneumococcal Conjugate-13 04/20/2016   Pneumococcal Polysaccharide-23 05/26/2017   Tdap 12/20/2017   Zoster Recombinant(Shingrix)  07/12/2021, 09/14/2021   Zoster, Live 11/17/2011   Patient Active Problem List   Diagnosis Date Noted   Post-polio syndrome, no weakness 03/07/2013    Past Medical History:  Diagnosis Date   Hepatitis A    years ago   History of post-polio syndrome 1975   No residual weakness    Past Surgical History:  Procedure Laterality Date   COLONOSCOPY  05/2011   COLONOSCOPY WITH PROPOFOL N/A 05/13/2020   Procedure: COLONOSCOPY WITH PROPOFOL;  Surgeon: Toledo, Boykin Nearing, MD;  Location: [facility] ENDOSCOPY;  Service: Gastroenterology;  Laterality: N/A;    Family History  Problem Relation Age of Onset   Heart failure Father     Social History   Social History Narrative   Active cyclist, swimmer   Exercises 12 hours a week. Swims 1 mile at a time, 1 hour twice a week    Past Medical History, Surgical History, Social History, Family History, Problem List, Medications, and Allergies have been reviewed and updated if relevant.  Review of Systems: Pertinent positives are listed above.  Otherwise, a full 14 point review of systems has been done in full and it is negative except where it is noted positive.  Objective:   There were no vitals taken for this visit. Ideal Body Weight:    Ideal Body Weight:   No results found.    09/29/2022   12:02 PM 12/22/2021   10:45 AM 12/16/2020    9:05 AM 12/11/2019    9:12 AM 01/09/2019    9:21 AM  Depression screen PHQ 2/9  Decreased Interest 0 0 0 0 0  Down, Depressed, Hopeless 0 0 0 0 0  PHQ - 2 Score 0  0 0 0 0     GEN: well developed, well nourished, no acute distress Eyes: conjunctiva and lids normal, PERRLA, EOMI ENT: TM clear, nares clear, oral exam WNL Neck: supple, no lymphadenopathy, no thyromegaly, no JVD Pulm: clear to auscultation and percussion, respiratory effort normal CV: regular rate and rhythm, S1-S2, no murmur, rub or gallop, no bruits, peripheral pulses normal and symmetric, no cyanosis, clubbing, edema or varicosities GI:  soft, non-tender; no hepatosplenomegaly, masses; active bowel sounds all quadrants GU: deferred Lymph: no cervical, axillary or inguinal adenopathy MSK: gait normal, muscle tone and strength WNL, no joint swelling, effusions, discoloration, crepitus  SKIN: clear, good turgor, color WNL, no rashes, lesions, or ulcerations Neuro: normal mental status, normal strength, sensation, and motion Psych: alert; oriented to person, place and time, normally interactive and not anxious or depressed in appearance.  All labs reviewed with patient. Results for orders placed or performed in visit on 11/04/22  PSA, Medicare  Result Value Ref Range   PSA 1.40 0.10 - 4.00 ng/ml  Lipid panel  Result Value Ref Range   Cholesterol 191 0 - 200 mg/dL   Triglycerides 28.4 0.0 - 149.0 mg/dL   HDL 13.24 >40.10 mg/dL   VLDL 27.2 0.0 - 53.6 mg/dL   LDL Cholesterol 644 (H) 0 - 99 mg/dL   Total CHOL/HDL Ratio 3    NonHDL 134.24   Hepatic function panel  Result Value Ref Range   Total Bilirubin 1.2 0.2 - 1.2 mg/dL   Bilirubin, Direct 0.2 0.0 - 0.3 mg/dL   Alkaline Phosphatase 62 39 - 117 U/L   AST 21 0 - 37 U/L   ALT 16 0 - 53 U/L   Total Protein 6.5 6.0 - 8.3 g/dL   Albumin 4.1 3.5 - 5.2 g/dL  CBC with Differential/Platelet  Result Value Ref Range   WBC 6.8 4.0 - 10.5 K/uL   RBC 4.67 4.22 - 5.81 Mil/uL   Hemoglobin 15.1 13.0 - 17.0 g/dL   HCT 03.4 74.2 - 59.5 %   MCV 96.7 78.0 - 100.0 fl   MCHC 33.5 30.0 - 36.0 g/dL   RDW 63.8 75.6 - 43.3 %   Platelets 211.0 150.0 - 400.0 K/uL   Neutrophils Relative % 62.9 43.0 - 77.0 %   Lymphocytes Relative 19.8 12.0 - 46.0 %   Monocytes Relative 8.2 3.0 - 12.0 %   Eosinophils Relative 7.9 (H) 0.0 - 5.0 %   Basophils Relative 1.2 0.0 - 3.0 %   Neutro Abs 4.2 1.4 - 7.7 K/uL   Lymphs Abs 1.3 0.7 - 4.0 K/uL   Monocytes Absolute 0.6 0.1 - 1.0 K/uL   Eosinophils Absolute 0.5 0.0 - 0.7 K/uL   Basophils Absolute 0.1 0.0 - 0.1 K/uL  Basic metabolic panel  Result Value  Ref Range   Sodium 138 135 - 145 mEq/L   Potassium 4.0 3.5 - 5.1 mEq/L   Chloride 102 96 - 112 mEq/L   CO2 29 19 - 32 mEq/L   Glucose, Bld 94 70 - 99 mg/dL   BUN 14 6 - 23 mg/dL   Creatinine, Ser 2.95 0.40 - 1.50 mg/dL   GFR 18.84 >16.60 mL/min   Calcium 8.8 8.4 - 10.5 mg/dL    Assessment and Plan:     ICD-10-CM   1. Healthcare maintenance  Z00.00       Health Maintenance Exam: The patient's preventative maintenance and recommended screening tests for an annual wellness exam were reviewed in full today. Brought up  to date unless services declined.  Counselled on the importance of diet, exercise, and its role in overall health and mortality. The patient's FH and SH was reviewed, including their home life, tobacco status, and drug and alcohol status.  Follow-up in 1 year for physical exam or additional follow-up below.  Disposition: No follow-ups on file.  No orders of the defined types were placed in this encounter.  There are no discontinued medications. No orders of the defined types were placed in this encounter.   Signed,  Elpidio Galea. Kaiyan Luczak, MD   Allergies as of 02/01/2023   No Known Allergies      Medication List        Accurate as of January 31, 2023 12:33 PM. If you have any questions, ask your nurse or doctor.          CALCIUM 600 PO Take 1 tablet by mouth daily.   MULTIVITAMIN PO Take 1 tablet by mouth daily.   PRESERVISION AREDS 2 PO Take 1 tablet by mouth daily.

## 2023-02-01 ENCOUNTER — Encounter: Payer: Self-pay | Admitting: Family Medicine

## 2023-02-01 ENCOUNTER — Ambulatory Visit: Payer: Medicare HMO | Admitting: Family Medicine

## 2023-02-01 VITALS — BP 142/78 | HR 82 | Temp 98.5°F | Ht 63.0 in | Wt 135.6 lb

## 2023-02-01 DIAGNOSIS — Z0000 Encounter for general adult medical examination without abnormal findings: Secondary | ICD-10-CM | POA: Diagnosis not present

## 2023-02-01 NOTE — Patient Instructions (Signed)
RSV Vaccine

## 2023-02-02 NOTE — Progress Notes (Unsigned)
    Danny Statzer T. [name], MD, CAQ Sports Medicine [facility] at [facility] [address] [place], [address]  Phone: [phone]  FAX: [phone]  Danny Cameron - 72 y.o. male  MRN [id number]  Date of Birth: [birth year]  Date: 02/03/2023  PCP: Hannah Beat, MD  Referral: Hannah Beat, MD  No chief complaint on file.   Patient presents for an acute cryotherapy appointment for a seborrheic keratosis on the top of his head.

## 2023-02-03 ENCOUNTER — Ambulatory Visit: Payer: Medicare HMO | Admitting: Family Medicine

## 2023-02-03 ENCOUNTER — Encounter: Payer: Self-pay | Admitting: Family Medicine

## 2023-02-03 VITALS — BP 118/78 | HR 77 | Temp 97.4°F | Ht 63.0 in | Wt 134.1 lb

## 2023-02-03 DIAGNOSIS — L821 Other seborrheic keratosis: Secondary | ICD-10-CM

## 2023-02-03 DIAGNOSIS — L820 Inflamed seborrheic keratosis: Secondary | ICD-10-CM

## 2023-02-15 ENCOUNTER — Ambulatory Visit: Payer: Medicare HMO | Admitting: Dermatology

## 2023-05-10 DIAGNOSIS — H524 Presbyopia: Secondary | ICD-10-CM | POA: Diagnosis not present

## 2023-05-10 DIAGNOSIS — H353131 Nonexudative age-related macular degeneration, bilateral, early dry stage: Secondary | ICD-10-CM | POA: Diagnosis not present

## 2023-07-19 ENCOUNTER — Ambulatory Visit: Payer: Self-pay

## 2023-07-19 NOTE — Telephone Encounter (Signed)
 Copied from CRM [id number]. Topic: Clinical - Red Word Triage >> Jul 19, 2023  7:59 AM Rosamond Comes wrote: Red Word that prompted transfer to Nurse Triage: patient calling in, throat really raw, coughing up mucus is clear, tooth pain, ear pain left side of face.   Chief Complaint: Sore throat Symptoms: sore throat, tooth pain upper left Frequency: 4 days Pertinent Negatives: Patient denies chest pain, difficulty breathing, fever, nausea, vomiting, diarrhea, swelling of face or throat Disposition: [] ED /[x] Urgent Care (no appt availability in office) / [] Appointment(In office/virtual)/ []  [place]/ [] Home Care/ [] Refused Recommended Disposition /[] [place] [facility]/ []  Follow-up with PCP Additional Notes: Patient called and advised that he is having pain in his throat, tooth pain, and ear pain on the left side of his face that started 4 days. He has been taking Advil. Patient states that he has a tooth on the left upper that he is going to see his dentist about. Patient states that he does have pain in his left ear and all around this area.  He does states that swallowing is getting uncomfortable. Patient denies any chest pain, difficulty breathing, fever, nausea, vomiting, diarrhea, swelling of face or throat. He states that he is still eating and hydrating well but there is discomfort when eating in his throat. The recommendation is that the patient is seen and evaluated by a provider in the next 24 hours based on his symptoms. Patient states that he is currently in [place] at [employer] for his anniversary.  He states that he will be back home Wednesday (in two days).  He is advised that with these symptoms, involving pain in his throat, left ear, a tooth in the upper left, with some discomfort while swallowing--the recommendation is that he is seen and evaluated by a provider in the next 24 hours.  Patient is also advised that if he gets worse to go to the Emergency Room for  immediate medical attention. Patient is also advised that after he is seen and evaluated in the next 24 hours, if he would like to follow up with his PCP when he returns home, he can call back to set that up. Patient verbalized understanding.  Reason for Disposition  Earache also present  Answer Assessment - Initial Assessment Questions 1. ONSET: "When did the throat start hurting?" (Hours or days ago)      2-3 days 2. SEVERITY: "How bad is the sore throat?" (Scale 1-10; mild, moderate or severe)   - MILD (1-3):  Doesn't interfere with eating or normal activities.   - MODERATE (4-7): Interferes with eating some solids and normal activities.   - SEVERE (8-10):  Excruciating pain, interferes with most normal activities.   - SEVERE WITH DYSPHAGIA (10): Can't swallow liquids, drooling.     2 3. STREP EXPOSURE: "Has there been any exposure to strep within the past week?" If Yes, ask: "What type of contact occurred?"      No known sick contact 4.  VIRAL SYMPTOMS: "Are there any symptoms of a cold, such as a runny nose, cough, hoarse voice or red eyes?"      Cough with clear phlegm, tooth pain left upper,  5. FEVER: "Do you have a fever?" If Yes, ask: "What is your temperature, how was it measured, and when did it start?"     No known fever 6. PUS ON THE TONSILS: "Is there pus on the tonsils in the back of your throat?"     N/a  7. OTHER SYMPTOMS: "Do you have any other symptoms?" (e.g., difficulty breathing, headache, rash)     Cough with clear phlegm  Protocols used: Sore Throat-A-AH

## 2023-07-20 ENCOUNTER — Ambulatory Visit (INDEPENDENT_AMBULATORY_CARE_PROVIDER_SITE_OTHER)

## 2023-07-20 ENCOUNTER — Ambulatory Visit
Admission: RE | Admit: 2023-07-20 | Discharge: 2023-07-20 | Disposition: A | Discharge status: Home / Self Care | Source: Ambulatory Visit

## 2023-07-20 VITALS — BP 120/80 | HR 63 | Temp 98.9°F | Ht 63.0 in | Wt 133.6 lb

## 2023-07-20 DIAGNOSIS — J351 Hypertrophy of tonsils: Secondary | ICD-10-CM | POA: Diagnosis not present

## 2023-07-20 DIAGNOSIS — J028 Acute pharyngitis due to other specified organisms: Secondary | ICD-10-CM

## 2023-07-20 DIAGNOSIS — J039 Acute tonsillitis, unspecified: Secondary | ICD-10-CM

## 2023-07-20 DIAGNOSIS — M47812 Spondylosis without myelopathy or radiculopathy, cervical region: Secondary | ICD-10-CM | POA: Diagnosis not present

## 2023-07-20 DIAGNOSIS — I6523 Occlusion and stenosis of bilateral carotid arteries: Secondary | ICD-10-CM | POA: Diagnosis not present

## 2023-07-20 MED ORDER — AMOXICILLIN-POT CLAVULANATE 875-125 MG PO TABS
1.0000 | ORAL_TABLET | Freq: Two times a day (BID) | ORAL | 0 refills | Status: DC
Start: 2023-07-20 — End: 2023-12-08

## 2023-07-20 MED ORDER — PREDNISONE 20 MG PO TABS
40.0000 mg | ORAL_TABLET | Freq: Every day | ORAL | 0 refills | Status: AC
Start: 2023-07-20 — End: 2023-07-25

## 2023-07-20 MED ORDER — IOHEXOL 300 MG/ML  SOLN
75.0000 mL | Freq: Once | INTRAMUSCULAR | Status: AC | PRN
  Administered 2023-07-20: 75 mL via INTRAVENOUS

## 2023-07-20 NOTE — Assessment & Plan Note (Addendum)
 Patient has uvular erythema, deviation to the right, left tonsillar hypertrophy with erythema and exudates. D/D includes tonsillitis, peritonsillar abscess, epiglottitis. I recommend patient to be evaluated in the ED with imaging, potential need for IV antibiotics. Patient is not too keen on going to the ED and waiting for 8 plus hours. Mutual decision was made to get stat CT of neck, if concerning imaging findings patient will go to the ED. If CT is not concerning we will start him on Augmentin  BID for 10 days, Prednisone  40 mg for 5 days, ENT referral and close f/u. Patient agreeable. Imaging ordered. We will also obtain swabs for aerobes and anaerobes. Red flag symptoms discussed with the patient and counseled to go to ED if so were to occur.

## 2023-07-20 NOTE — Patient Instructions (Signed)
--   We are going to get a CT of your neck. If it is suggestive of epiglottis or peritonsillar abscess I will recommend you be seen in th ER for further evaluation and treatment.   -- Otherwise we will start you on antibiotic called Augmentin , twice a day for 10 days. I also recommend starting Prednisone  40 mg daily for 5 days.   -- If you start to have muffled voice, neck stiffness, fever, drooling you should be seen in the ER.  -- Salt water gargle, 4-5 times day.

## 2023-07-20 NOTE — Progress Notes (Addendum)
 Acute Office Visit  Subjective:    Patient ID: Danny Cameron, male    DOB: 29-Aug-1950, 73 y.o.   MRN: [id number]  Chief Complaint  Patient presents with   Sore Throat   Ear Pain    Patient is in today for the following acute concern:  Sore Throat  This is a new (Left facial, oral discomfurt that started about 6 days ago.) problem. The problem has been gradually worsening. There has been no fever. The pain is at a severity of 2/10. The pain is moderate. Associated symptoms include ear pain (pressure like sensation on left ear that started about 4 days ago), a hoarse voice and swollen glands. Pertinent negatives include no headaches, neck pain or shortness of breath. He has had no exposure to strep. He has tried NSAIDs for the symptoms.  Coughing up mucous. Waking up multiple times at night thirsty.   Dyspnea: No  No worsening throat, neck pain. No trismus, no fever, no bleeding, u/l tonsil, abscess, red pharynx,    Review of Systems  HENT:  Positive for ear pain (pressure like sensation on left ear that started about 4 days ago) and hoarse voice.   Respiratory:  Negative for shortness of breath.   Musculoskeletal:  Negative for neck pain.  Neurological:  Negative for headaches.   As per HPI    Objective:    BP 120/80   Pulse 63   Temp 98.9 F (37.2 C) (Oral)   Ht 5\' 3"  (1.6 m)   Wt 133 lb 9.6 oz (60.6 kg)   SpO2 95%   BMI 23.67 kg/m    Physical Exam HENT:     Head: Normocephalic and atraumatic.     Jaw: Tenderness and pain on movement (pain with fully opening the mouth) present.     Mouth/Throat:     Mouth: Mucous membranes are moist.     Pharynx: Posterior oropharyngeal erythema and uvula swelling present.     Tonsils: Tonsillar abscess present. No tonsillar exudate. 2+ on the right. 3+ on the left.     Comments: Uvula deviated to right Cardiovascular:     Rate and Rhythm: Normal rate.  Pulmonary:     Effort: Pulmonary effort is normal.     Breath  sounds: No wheezing.  Abdominal:     Palpations: Abdomen is soft.  Musculoskeletal:     Cervical back: Neck supple.  Lymphadenopathy:     Cervical: No cervical adenopathy.  Neurological:     Mental Status: He is alert.     No results found for any visits on 07/20/23.     Assessment & Plan:  Tonsillitis Assessment & Plan: Patient has uvular erythema, deviation to the right, left tonsillar hypertrophy with erythema and exudates. D/D includes tonsillitis, peritonsillar abscess, epiglottitis. I recommend patient to be evaluated in the ED with imaging, potential need for IV antibiotics. Patient is not too keen on going to the ED and waiting for 8 plus hours. Mutual decision was made to get stat CT of neck, if concerning imaging findings patient will go to the ED. If CT is not concerning we will start him on Augmentin  BID for 10 days, Prednisone  40 mg for 5 days, ENT referral and close f/u. Patient agreeable. Imaging ordered. We will also obtain swabs for aerobes and anaerobes. Red flag symptoms discussed with the patient and counseled to go to ED if so were to occur.  Orders: -     CT SOFT TISSUE NECK W CONTRAST; Future -  Anaerobic and Aerobic Culture -     predniSONE ; Take 2 tablets (40 mg total) by mouth daily with breakfast for 5 days.  Dispense: 10 tablet; Refill: 0 -     Amoxicillin -Pot Clavulanate; Take 1 tablet by mouth 2 (two) times daily.  Dispense: 20 tablet; Refill: 0 -     Ambulatory referral to ENT  Acute pharyngitis due to other specified organisms -     CT SOFT TISSUE NECK W CONTRAST; Future -     Anaerobic and Aerobic Culture -     Amoxicillin -Pot Clavulanate; Take 1 tablet by mouth 2 (two) times daily.  Dispense: 20 tablet; Refill: 0 -     Ambulatory referral to ENT    Return if symptoms worsen or fail to improve.  Jacklin Mascot, MD ----------------------------------  After patient left today's appointment CT- neck and soft tissue reviewed as follows:    Prominence of the posterior pharyngeal mucosal tissues consistent with pharyngitis. Asymmetric enlargement of the left tonsil with asymmetric indistinct low density most consistent with phlegmonous inflammation. No convincing discrete tonsillar or peritonsillar abscess at this moment. No extension of the inflammatory change to the parapharyngeal space.   - Recommend Prednisone  40 mg daily for 5 days.  - Augmentin  BID for 10 days. - Urgent ENT referral made today. Patient counseled to reach to out clinic if he has not heard from ENT by 07/22/23.  - Recommend f/u within 36-48 hours if symptoms does not improve otherwise f/u with PCP in one week.  - Patient verbalized understanding.   Jacklin Mascot, MD

## 2023-07-20 NOTE — Addendum Note (Signed)
 Addended by: Karlei Waldo on: 07/20/2023 04:10 PM   Modules accepted: Orders, Level of Service

## 2023-07-21 ENCOUNTER — Ambulatory Visit: Admitting: Family Medicine

## 2023-07-21 NOTE — Progress Notes (Signed)
 Updated patient on culture result. On Augmentin . Has appointment with PCP next Wednesday. He is feeling better today compared to his OV on 07/20/22.  Jacklin Mascot, MD

## 2023-07-23 NOTE — Progress Notes (Signed)
 Noted. On Augmentin . Discussed result with the patient during previous phone call.   Jacklin Mascot, MD

## 2023-07-26 ENCOUNTER — Ambulatory Visit: Admitting: Family Medicine

## 2023-07-27 LAB — ANAEROBIC AND AEROBIC CULTURE
AER RESULT:: NORMAL
MICRO NUMBER:: 16359494
MICRO NUMBER:: 16359495
SPECIMEN QUALITY:: ADEQUATE
SPECIMEN QUALITY:: ADEQUATE

## 2023-07-27 NOTE — Progress Notes (Unsigned)
     Danny Cameron T. Achillies Buehl, MD, CAQ Sports Medicine [facility] at [facility] [address] [place], [address]  Phone: [phone]  FAX: [phone]  Danny Cameron - 73 y.o. male  MRN [id number]  Date of Birth: 1950-10-10  Date: 07/28/2023  PCP: Scherrie Curt, MD  Referral: Scherrie Curt, MD  No chief complaint on file.  Subjective:   Danny Cameron is a 73 y.o. very pleasant male patient with There is no height or weight on file to calculate BMI. who presents with the following:  He is here to follow-up with me after diagnosis of tonsillitis and pharyngitis.  He saw a different physician in another office, and she was concern for possible peritonsillar abscess.  The patient had what was essentially normal CT.  At that time, he was placed on Augmentin  as well as prednisone .    Review of Systems is noted in the HPI, as appropriate  Objective:   There were no vitals taken for this visit.  GEN: No acute distress; alert,appropriate. PULM: Breathing comfortably in no respiratory distress PSYCH: Normally interactive.   Laboratory and Imaging Data:  Assessment and Plan:   ***

## 2023-07-28 ENCOUNTER — Ambulatory Visit (INDEPENDENT_AMBULATORY_CARE_PROVIDER_SITE_OTHER): Admitting: Family Medicine

## 2023-07-28 ENCOUNTER — Encounter: Payer: Self-pay | Admitting: Family Medicine

## 2023-07-28 VITALS — BP 122/80 | HR 79 | Temp 99.3°F | Ht 63.0 in | Wt 136.0 lb

## 2023-07-28 DIAGNOSIS — J039 Acute tonsillitis, unspecified: Secondary | ICD-10-CM

## 2023-07-28 DIAGNOSIS — J028 Acute pharyngitis due to other specified organisms: Secondary | ICD-10-CM

## 2023-11-08 DIAGNOSIS — H353131 Nonexudative age-related macular degeneration, bilateral, early dry stage: Secondary | ICD-10-CM | POA: Diagnosis not present

## 2023-12-07 NOTE — Progress Notes (Unsigned)
     Romeo Zielinski T. Beckham Capistran, MD, CAQ Sports Medicine [facility] [address] [place], [address]  Phone: [phone]  FAX: [phone]  Danny Cameron - 73 y.o. male  MRN [id number]  Date of Birth: 1950/04/20  Date: 12/08/2023  PCP: Watt Mirza, MD  Referral: Watt Mirza, MD  No chief complaint on file.  Subjective:   Danny Cameron is a 73 y.o. very pleasant male patient with There is no height or weight on file to calculate BMI. who presents with the following:  Discussed the use of AI scribe software for clinical note transcription with the patient, who gave verbal consent to proceed.  Well-known patient who presents with ongoing bilateral shoulder pain. History of Present Illness     Review of Systems is noted in the HPI, as appropriate  Objective:   There were no vitals taken for this visit.  GEN: No acute distress; alert,appropriate. PULM: Breathing comfortably in no respiratory distress PSYCH: Normally interactive.   Physical Exam   Laboratory and Imaging Data:  Assessment and Plan:   No diagnosis found. Assessment & Plan   Medication Management during today's office visit: No orders of the defined types were placed in this encounter.  There are no discontinued medications.  Orders placed today for conditions managed today: No orders of the defined types were placed in this encounter.   Disposition: No follow-ups on file.  [employer] One speech-to-text software was used for transcription in this dictation.  Possible transcriptional errors can occur using Animal nutritionist.   Signed,  Mirza DASEN. Claud Gowan, MD   Outpatient Encounter Medications as of 12/08/2023  Medication Sig   amoxicillin -clavulanate (AUGMENTIN ) 875-125 MG tablet Take 1 tablet by mouth 2 (two) times daily.   Calcium Carbonate (CALCIUM 600 PO) Take 1 tablet by mouth daily.   Multiple Vitamin (MULTIVITAMIN PO) Take 1  tablet by mouth daily.   Multiple Vitamins-Minerals (PRESERVISION AREDS 2 PO) Take 1 tablet by mouth daily.   No facility-administered encounter medications on file as of 12/08/2023.

## 2023-12-08 ENCOUNTER — Encounter: Payer: Self-pay | Admitting: Family Medicine

## 2023-12-08 ENCOUNTER — Ambulatory Visit (INDEPENDENT_AMBULATORY_CARE_PROVIDER_SITE_OTHER): Admitting: Family Medicine

## 2023-12-08 ENCOUNTER — Ambulatory Visit (INDEPENDENT_AMBULATORY_CARE_PROVIDER_SITE_OTHER)
Admission: RE | Admit: 2023-12-08 | Discharge: 2023-12-08 | Disposition: A | Discharge status: Home / Self Care | Source: Ambulatory Visit | Attending: Family Medicine | Admitting: Family Medicine

## 2023-12-08 VITALS — BP 134/72 | HR 72 | Temp 98.9°F | Ht 63.0 in | Wt 137.5 lb

## 2023-12-08 DIAGNOSIS — M19012 Primary osteoarthritis, left shoulder: Secondary | ICD-10-CM | POA: Diagnosis not present

## 2023-12-08 DIAGNOSIS — G8929 Other chronic pain: Secondary | ICD-10-CM

## 2023-12-08 DIAGNOSIS — M25512 Pain in left shoulder: Secondary | ICD-10-CM | POA: Diagnosis not present

## 2023-12-08 DIAGNOSIS — M25511 Pain in right shoulder: Secondary | ICD-10-CM

## 2023-12-08 DIAGNOSIS — M778 Other enthesopathies, not elsewhere classified: Secondary | ICD-10-CM | POA: Diagnosis not present

## 2023-12-15 ENCOUNTER — Ambulatory Visit: Payer: Self-pay | Admitting: Family Medicine

## 2023-12-21 NOTE — Progress Notes (Unsigned)
     Axzel Rockhill T. Noelle Hoogland, MD, CAQ Sports Medicine [facility] [address] [place], [address]  Phone: [phone]  FAX: [phone]  Danny Cameron - 73 y.o. male  MRN [id number]  Date of Birth: 22-Jun-1950  Date: 12/22/2023  PCP: Watt Mirza, MD  Referral: Watt Mirza, MD  No chief complaint on file.  Subjective:   Danny Cameron is a 73 y.o. very pleasant male patient with There is no height or weight on file to calculate BMI. who presents with the following:  Discussed the use of AI scribe software for clinical note transcription with the patient, who gave verbal consent to proceed.  Is a very well-known patient who I have known for many years.  He did have an issue with his back several years ago and he presents with some ongoing acute back pain. History of Present Illness     Review of Systems is noted in the HPI, as appropriate  Objective:   There were no vitals taken for this visit.  GEN: No acute distress; alert,appropriate. PULM: Breathing comfortably in no respiratory distress PSYCH: Normally interactive.   Physical Exam   Laboratory and Imaging Data:  Assessment and Plan:   No diagnosis found. Assessment & Plan   Medication Management during today's office visit: No orders of the defined types were placed in this encounter.  There are no discontinued medications.  Orders placed today for conditions managed today: No orders of the defined types were placed in this encounter.   Disposition: No follow-ups on file.  [employer] One speech-to-text software was used for transcription in this dictation.  Possible transcriptional errors can occur using Animal nutritionist.   Signed,  Mirza DASEN. Sherhonda Gaspar, MD   Outpatient Encounter Medications as of 12/22/2023  Medication Sig   Calcium Carbonate (CALCIUM 600 PO) Take 1 tablet by mouth daily.   Multiple Vitamin (MULTIVITAMIN PO) Take 1 tablet  by mouth daily.   Multiple Vitamins-Minerals (PRESERVISION AREDS 2 PO) Take 1 tablet by mouth daily.   No facility-administered encounter medications on file as of 12/22/2023.

## 2023-12-22 ENCOUNTER — Encounter: Payer: Self-pay | Admitting: Family Medicine

## 2023-12-22 ENCOUNTER — Ambulatory Visit
Admission: RE | Admit: 2023-12-22 | Discharge: 2023-12-22 | Disposition: A | Discharge status: Home / Self Care | Source: Ambulatory Visit | Attending: Family Medicine | Admitting: Family Medicine

## 2023-12-22 ENCOUNTER — Ambulatory Visit (INDEPENDENT_AMBULATORY_CARE_PROVIDER_SITE_OTHER): Admitting: Family Medicine

## 2023-12-22 VITALS — BP 132/80 | HR 79 | Temp 98.1°F | Ht 63.0 in | Wt 139.0 lb

## 2023-12-22 DIAGNOSIS — M4317 Spondylolisthesis, lumbosacral region: Secondary | ICD-10-CM | POA: Diagnosis not present

## 2023-12-22 DIAGNOSIS — M545 Low back pain, unspecified: Secondary | ICD-10-CM | POA: Diagnosis not present

## 2023-12-22 DIAGNOSIS — M48061 Spinal stenosis, lumbar region without neurogenic claudication: Secondary | ICD-10-CM | POA: Diagnosis not present

## 2023-12-22 DIAGNOSIS — M47816 Spondylosis without myelopathy or radiculopathy, lumbar region: Secondary | ICD-10-CM | POA: Diagnosis not present

## 2023-12-22 DIAGNOSIS — M549 Dorsalgia, unspecified: Secondary | ICD-10-CM | POA: Diagnosis not present

## 2023-12-22 MED ORDER — PREDNISONE 20 MG PO TABS
ORAL_TABLET | ORAL | 0 refills | Status: DC
Start: 2023-12-22 — End: 2024-02-16

## 2023-12-27 ENCOUNTER — Ambulatory Visit: Payer: Self-pay | Admitting: Family Medicine

## 2024-02-08 ENCOUNTER — Other Ambulatory Visit (INDEPENDENT_AMBULATORY_CARE_PROVIDER_SITE_OTHER)

## 2024-02-08 DIAGNOSIS — Z13220 Encounter for screening for lipoid disorders: Secondary | ICD-10-CM | POA: Diagnosis not present

## 2024-02-08 DIAGNOSIS — Z131 Encounter for screening for diabetes mellitus: Secondary | ICD-10-CM | POA: Diagnosis not present

## 2024-02-08 DIAGNOSIS — Z0000 Encounter for general adult medical examination without abnormal findings: Secondary | ICD-10-CM | POA: Diagnosis not present

## 2024-02-08 DIAGNOSIS — Z125 Encounter for screening for malignant neoplasm of prostate: Secondary | ICD-10-CM | POA: Diagnosis not present

## 2024-02-08 LAB — LIPID PANEL
Cholesterol: 193 mg/dL (ref 0–200)
HDL: 49.3 mg/dL (ref >39.00)
LDL Cholesterol: 128 mg/dL — ABNORMAL HIGH (ref 0–99)
NonHDL: 143.7
Total CHOL/HDL Ratio: 4
Triglycerides: 81 mg/dL (ref 0.0–149.0)
VLDL: 16.2 mg/dL (ref 0.0–40.0)

## 2024-02-08 LAB — HEPATIC FUNCTION PANEL
ALT: 16 U/L (ref 0–53)
AST: 21 U/L (ref 0–37)
Albumin: 4.2 g/dL (ref 3.5–5.2)
Alkaline Phosphatase: 67 U/L (ref 39–117)
Bilirubin, Direct: 0.1 mg/dL (ref 0.0–0.3)
Total Bilirubin: 0.9 mg/dL (ref 0.2–1.2)
Total Protein: 6.4 g/dL (ref 6.0–8.3)

## 2024-02-08 LAB — CBC WITH DIFFERENTIAL/PLATELET
Basophils Absolute: 0.1 K/uL (ref 0.0–0.1)
Basophils Relative: 0.9 % (ref 0.0–3.0)
Eosinophils Absolute: 0.3 K/uL (ref 0.0–0.7)
Eosinophils Relative: 5 % (ref 0.0–5.0)
HCT: 44 % (ref 39.0–52.0)
Hemoglobin: 15.4 g/dL (ref 13.0–17.0)
Lymphocytes Relative: 18.8 % (ref 12.0–46.0)
Lymphs Abs: 1.2 K/uL (ref 0.7–4.0)
MCHC: 35 g/dL (ref 30.0–36.0)
MCV: 93.3 fl (ref 78.0–100.0)
Monocytes Absolute: 0.6 K/uL (ref 0.1–1.0)
Monocytes Relative: 8.4 % (ref 3.0–12.0)
Neutro Abs: 4.4 K/uL (ref 1.4–7.7)
Neutrophils Relative %: 66.9 % (ref 43.0–77.0)
Platelets: 194 K/uL (ref 150.0–400.0)
RBC: 4.72 Mil/uL (ref 4.22–5.81)
RDW: 12.9 % (ref 11.5–15.5)
WBC: 6.6 K/uL (ref 4.0–10.5)

## 2024-02-08 LAB — BASIC METABOLIC PANEL WITH GFR
BUN: 15 mg/dL (ref 6–23)
CO2: 29 meq/L (ref 19–32)
Calcium: 8.7 mg/dL (ref 8.4–10.5)
Chloride: 104 meq/L (ref 96–112)
Creatinine, Ser: 0.84 mg/dL (ref 0.40–1.50)
GFR: 86.89 mL/min (ref >60.00)
Glucose, Bld: 88 mg/dL (ref 70–99)
Potassium: 4.1 meq/L (ref 3.5–5.1)
Sodium: 140 meq/L (ref 135–145)

## 2024-02-08 LAB — HEMOGLOBIN A1C: Hgb A1c MFr Bld: 5.3 % (ref 4.6–6.5)

## 2024-02-08 LAB — PSA, MEDICARE: PSA: 1.75 ng/mL (ref 0.10–4.00)

## 2024-02-15 NOTE — Progress Notes (Signed)
 "    Danny Cheese T. Apurva Reily, MD, CAQ Sports Medicine [facility] [address] [place], [address]  Phone: [phone]  FAX: [phone]  Danny Cameron - 73 y.o. male  MRN [id number]  Date of Birth: [birth year]  Date: 02/16/2024  PCP: Watt Mirza, MD  Referral: Watt Mirza, MD  Chief Complaint  Patient presents with   Medicare Wellness   Patient Care Team: Watt Mirza, MD as PCP - General (Family Medicine) Subjective:   Danny Cameron is a 73 y.o. pleasant patient who presents for a medicare wellness examination:  Preventative Health Maintenance Visit:  Health Maintenance Summary Reviewed and updated, unless pt declines services.  Tobacco History Reviewed. Alcohol: No concerns, no excessive use Exercise Habits: He is working out every day swimming, hiking, walking, he has backed off on the biking for.  Weight training.  Yoga. STD concerns: no risk or activity to increase risk Drug Use: None  Flu vaccine  He is a generally healthy 73 year old who presents for Medicare wellness exam.  Health Maintenance  Topic Date Due   Medicare Annual Wellness (AWV)  09/29/2023   COVID-19 Vaccine (9 - 2025-26 season) 11/29/2023   Hepatitis C Screening  09/14/2048 (Originally 03/22/1969)   DTaP/Tdap/Td (2 - Td or Tdap) 12/21/2027   Colonoscopy  05/13/2030   Pneumococcal Vaccine: 50+ Years  Completed   Influenza Vaccine  Completed   Zoster Vaccines- Shingrix  Completed   Meningococcal B Vaccine  Aged Out     Discussed the use of AI scribe software for clinical note transcription with the patient, who gave verbal consent to proceed.  History of Present Illness Danny Cameron is a 73 year old male who presents for an annual physical exam.  He maintains an active lifestyle, swimming three days a week for about an hour, covering approximately 2400 yards each session. He also engages in weight training and yoga six days a  week. Although he has not resumed biking since a back injury, he continues to hike and walk, sometimes covering four to five miles.  He adheres to a healthy diet, primarily consuming fish, pasta, chicken, and Mediterranean dishes. He limits alcohol intake to one beer per night, occasionally having two.    Immunization History  Administered Date(s) Administered   Fluad Quad(high Dose 65+) 11/29/2018, 12/11/2019, 12/06/2021, 11/08/2022   INFLUENZA, HIGH DOSE SEASONAL PF 01/04/2017, 12/10/2020, 12/21/2023   Influenza,inj,Quad PF,6+ Mos 12/08/2017   PFIZER Comirnaty(Gray Top)Covid-19 Tri-Sucrose Vaccine 01/02/2022, 11/08/2022   PFIZER(Purple Top)SARS-COV-2 Vaccination 04/15/2019, 05/06/2019, 01/12/2020, 07/26/2020   Pfizer Covid-19 Vaccine Bivalent Booster 25yrs & up 01/27/2021   Pfizer(Comirnaty)Fall Seasonal Vaccine 12 years and older 09/28/2023   Pneumococcal Conjugate-13 04/20/2016   Pneumococcal Polysaccharide-23 05/26/2017   RSV,unspecified 02/01/2023   Tdap 12/20/2017   Zoster Recombinant(Shingrix) 07/12/2021, 09/14/2021   Zoster, Live 11/17/2011    Patient Active Problem List   Diagnosis Date Noted   Post-polio syndrome, no weakness 03/07/2013    Past Medical History:  Diagnosis Date   Hepatitis A    years ago   History of post-polio syndrome 1975   No residual weakness    Past Surgical History:  Procedure Laterality Date   COLONOSCOPY  05/2011   COLONOSCOPY WITH PROPOFOL  N/A 05/13/2020   Procedure: COLONOSCOPY WITH PROPOFOL ;  Surgeon: Toledo, Ladell POUR, MD;  Location: [facility] ENDOSCOPY;  Service: Gastroenterology;  Laterality: N/A;    Family History  Problem Relation Age of Onset   Heart failure Father     Social History  Social History Narrative   Active cyclist, swimmer   Exercises 12 hours a week. Swims 1 mile at a time, 1 hour twice a week    Past Medical History, Surgical History, Social History, Family History, Problem List, Medications, and Allergies  have been reviewed and updated if relevant.  Review of Systems: Pertinent positives are listed above.  Otherwise, a full 14 point review of systems has been done in full and it is negative except where it is noted positive.  Objective:   BP 128/80   Pulse 83   Temp (!) 97.1 F (36.2 C) (Temporal)   Ht 5' 4.61 (1.641 m)   Wt 137 lb 8 oz (62.4 kg)   SpO2 96%   BMI 23.16 kg/m     09/29/2022   12:06 PM 02/01/2023   10:30 AM 07/28/2023    9:46 AM 12/08/2023   12:20 PM 02/16/2024   10:23 AM  Fall Risk  Falls in the past year? 0 0 0 0 0  Was there an injury with Fall? 0 0 0  0  Fall Risk Category Calculator 0 0 0  0  Patient at Risk for Falls Due to No Fall Risks No Fall Risks No Fall Risks No Fall Risks No Fall Risks  Fall risk Follow up Falls prevention discussed;Education provided;Falls evaluation completed Falls evaluation completed Falls evaluation completed  Falls evaluation completed   Ideal Body Weight: Weight in (lb) to have BMI = 25: 148.1 Hearing Screening  Method: Audiometry   500Hz  1000Hz  2000Hz  4000Hz   Right ear 20 20 20 20   Left ear 20 0 20 20  Vision Screening - Comments:: Wears Glasses-Eye Exam with Dr. Carolee 05/2023    02/16/2024   10:23 AM 12/08/2023   12:21 PM 02/01/2023   10:30 AM 09/29/2022   12:02 PM 12/22/2021   10:45 AM  Depression screen PHQ 2/9  Decreased Interest 0 0 0 0 0  Down, Depressed, Hopeless 0 0 0 0 0  PHQ - 2 Score 0 0 0 0 0  Altered sleeping  0 0    Tired, decreased energy  0 0    Change in appetite  0 0    Feeling bad or failure about yourself   0 0    Trouble concentrating  0 0    Moving slowly or fidgety/restless  0 0    Suicidal thoughts  0 0    PHQ-9 Score  0  0     Difficult doing work/chores   Not difficult at all       Data saved with a previous flowsheet row definition     GEN: well developed, well nourished, no acute distress Eyes: conjunctiva and lids normal, PERRLA, EOMI ENT: TM clear, nares clear, oral exam WNL Neck:  supple, no lymphadenopathy, no thyromegaly, no JVD Pulm: clear to auscultation and percussion, respiratory effort normal CV: regular rate and rhythm, S1-S2, no murmur, rub or gallop, no bruits, peripheral pulses normal and symmetric, no cyanosis, clubbing, edema or varicosities GI: soft, non-tender; no hepatosplenomegaly, masses; active bowel sounds all quadrants GU: deferred Lymph: no cervical, axillary or inguinal adenopathy MSK: gait normal, muscle tone and strength WNL, no joint swelling, effusions, discoloration, crepitus  SKIN: clear, good turgor, color WNL, no rashes, lesions, or ulcerations Neuro: normal mental status, normal strength, sensation, and motion Psych: alert; oriented to person, place and time, normally interactive and not anxious or depressed in appearance.  All labs reviewed with patient.  @resultssec @  Results for  orders placed or performed in visit on 02/08/24  CBC with Differential/Platelet   Collection Time: 02/08/24  7:30 AM  Result Value Ref Range   WBC 6.6 4.0 - 10.5 K/uL   RBC 4.72 4.22 - 5.81 Mil/uL   Hemoglobin 15.4 13.0 - 17.0 g/dL   HCT 55.9 60.9 - 47.9 %   MCV 93.3 78.0 - 100.0 fl   MCHC 35.0 30.0 - 36.0 g/dL   RDW 87.0 88.4 - 84.4 %   Platelets 194.0 150.0 - 400.0 K/uL   Neutrophils Relative % 66.9 43.0 - 77.0 %   Lymphocytes Relative 18.8 12.0 - 46.0 %   Monocytes Relative 8.4 3.0 - 12.0 %   Eosinophils Relative 5.0 0.0 - 5.0 %   Basophils Relative 0.9 0.0 - 3.0 %   Neutro Abs 4.4 1.4 - 7.7 K/uL   Lymphs Abs 1.2 0.7 - 4.0 K/uL   Monocytes Absolute 0.6 0.1 - 1.0 K/uL   Eosinophils Absolute 0.3 0.0 - 0.7 K/uL   Basophils Absolute 0.1 0.0 - 0.1 K/uL  Lipid panel   Collection Time: 02/08/24  7:30 AM  Result Value Ref Range   Cholesterol 193 0 - 200 mg/dL   Triglycerides 18.9 0.0 - 149.0 mg/dL   HDL 50.69 >60.99 mg/dL   VLDL 83.7 0.0 - 59.9 mg/dL   LDL Cholesterol 871 (H) 0 - 99 mg/dL   Total CHOL/HDL Ratio 4    NonHDL 143.70   Hepatic  function panel   Collection Time: 02/08/24  7:30 AM  Result Value Ref Range   Total Bilirubin 0.9 0.2 - 1.2 mg/dL   Bilirubin, Direct 0.1 0.0 - 0.3 mg/dL   Alkaline Phosphatase 67 39 - 117 U/L   AST 21 0 - 37 U/L   ALT 16 0 - 53 U/L   Total Protein 6.4 6.0 - 8.3 g/dL   Albumin 4.2 3.5 - 5.2 g/dL  Basic metabolic panel with GFR   Collection Time: 02/08/24  7:30 AM  Result Value Ref Range   Sodium 140 135 - 145 mEq/L   Potassium 4.1 3.5 - 5.1 mEq/L   Chloride 104 96 - 112 mEq/L   CO2 29 19 - 32 mEq/L   Glucose, Bld 88 70 - 99 mg/dL   BUN 15 6 - 23 mg/dL   Creatinine, Ser 9.15 0.40 - 1.50 mg/dL   GFR 13.10 >39.99 mL/min   Calcium 8.7 8.4 - 10.5 mg/dL  PSA, Medicare   Collection Time: 02/08/24  7:30 AM  Result Value Ref Range   PSA 1.75 0.10 - 4.00 ng/ml  Hemoglobin A1c   Collection Time: 02/08/24  7:30 AM  Result Value Ref Range   Hgb A1c MFr Bld 5.3 4.6 - 6.5 %    Assessment and Plan:     ICD-10-CM   1. Healthcare maintenance  Z00.00       Assessment and Plan Assessment & Plan Adult Wellness Visit Overall health excellent. Normal labs: A1c, blood count, liver function, blood sugar, kidney function, PSA. Slightly elevated LDL not concerning due to low risk and healthy lifestyle. Normal blood pressure, ideal body weight. Regular exercise and healthy diet. No cognitive issues. Lungs clear, hearing adequate. - Continue current exercise regimen and healthy diet. - Continue regular health maintenance and screenings.   Health Maintenance Exam: The patient's preventative maintenance and recommended screening tests for an annual wellness exam were reviewed in full today. Brought up to date unless services declined.  Counselled on the importance of diet, exercise, and  its role in overall health and mortality. The patient's FH and SH was reviewed, including their home life, tobacco status, and drug and alcohol status.  Follow-up in 1 year for physical exam or additional  follow-up below.  I have personally reviewed the Medicare Annual Wellness questionnaire and have noted 1. The patient's medical and social history 2. Their use of alcohol, tobacco or illicit drugs 3. Their current medications and supplements 4. The patient's functional ability including ADL's, fall risks, home safety risks and hearing or visual             impairment. 5. Diet and physical activities 6. Evidence for depression or mood disorders 7. Reviewed Updated provider list, see scanned forms and [facility] Snapshot.  8. Reviewed whether or not the patient has HCPOA or living will, and discussed what this means with the patient.  Recommended he bring in a copy for his chart in [facility].  The patients weight, height, BMI and visual acuity have been recorded in the chart I have made referrals, counseling and provided education to the patient based review of the above and I have provided the pt with a written personalized care plan for preventive services.  I have provided the patient with a copy of your personalized plan for preventive services. Instructed to take the time to review along with their updated medication list.  Disposition: No follow-ups on file.  No future appointments.   No orders of the defined types were placed in this encounter.  Medications Discontinued During This Encounter  Medication Reason   predniSONE  (DELTASONE ) 20 MG tablet Completed Course   No orders of the defined types were placed in this encounter.   Signed,  Jacques DASEN. [place], MD   Allergies as of 02/16/2024   No Known Allergies      Medication List        Accurate as of February 16, 2024 10:55 AM. If you have any questions, ask your nurse or doctor.          STOP taking these medications    predniSONE  20 MG tablet Commonly known as: DELTASONE  Stopped by: Jacques Tess Potts       TAKE these medications    CALCIUM 600 PO Take 1 tablet by mouth daily.   MULTIVITAMIN PO Take 1 tablet  by mouth daily.   PRESERVISION AREDS 2 PO Take 1 tablet by mouth daily.        "

## 2024-02-16 ENCOUNTER — Encounter: Payer: Self-pay | Admitting: Family Medicine

## 2024-02-16 ENCOUNTER — Ambulatory Visit: Admitting: Family Medicine

## 2024-02-16 VITALS — BP 128/80 | HR 83 | Temp 97.1°F | Ht 64.61 in | Wt 137.5 lb

## 2024-02-16 DIAGNOSIS — Z0000 Encounter for general adult medical examination without abnormal findings: Secondary | ICD-10-CM
